# Patient Record
Sex: Female | Born: 1960 | Race: White | Hispanic: No | Marital: Married | Smoking: Never smoker
Health system: Southern US, Community
[De-identification: ages and names within clinical notes are randomized; demographics above are authoritative.]

## PROBLEM LIST (undated history)

## (undated) DIAGNOSIS — I82409 Acute embolism and thrombosis of unspecified deep veins of unspecified lower extremity: Secondary | ICD-10-CM

## (undated) DIAGNOSIS — I959 Hypotension, unspecified: Secondary | ICD-10-CM

## (undated) DIAGNOSIS — Z8489 Family history of other specified conditions: Secondary | ICD-10-CM

## (undated) DIAGNOSIS — E0590 Thyrotoxicosis, unspecified without thyrotoxic crisis or storm: Secondary | ICD-10-CM

## (undated) DIAGNOSIS — G43909 Migraine, unspecified, not intractable, without status migrainosus: Secondary | ICD-10-CM

## (undated) DIAGNOSIS — Z9289 Personal history of other medical treatment: Secondary | ICD-10-CM

## (undated) DIAGNOSIS — H04123 Dry eye syndrome of bilateral lacrimal glands: Secondary | ICD-10-CM

## (undated) DIAGNOSIS — E0500 Thyrotoxicosis with diffuse goiter without thyrotoxic crisis or storm: Secondary | ICD-10-CM

## (undated) DIAGNOSIS — R112 Nausea with vomiting, unspecified: Secondary | ICD-10-CM

## (undated) DIAGNOSIS — Z993 Dependence on wheelchair: Secondary | ICD-10-CM

## (undated) DIAGNOSIS — Z9889 Other specified postprocedural states: Secondary | ICD-10-CM

## (undated) DIAGNOSIS — T8859XA Other complications of anesthesia, initial encounter: Secondary | ICD-10-CM

## (undated) DIAGNOSIS — Z87828 Personal history of other (healed) physical injury and trauma: Secondary | ICD-10-CM

## (undated) DIAGNOSIS — E0591 Thyrotoxicosis, unspecified with thyrotoxic crisis or storm: Secondary | ICD-10-CM

## (undated) DIAGNOSIS — D649 Anemia, unspecified: Secondary | ICD-10-CM

## (undated) DIAGNOSIS — M1990 Unspecified osteoarthritis, unspecified site: Secondary | ICD-10-CM

## (undated) HISTORY — DX: Dry eye syndrome of bilateral lacrimal glands: H04.123

## (undated) HISTORY — PX: OTHER SURGICAL HISTORY: SHX169

## (undated) HISTORY — DX: Migraine, unspecified, not intractable, without status migrainosus: G43.909

## (undated) HISTORY — DX: Thyrotoxicosis with diffuse goiter without thyrotoxic crisis or storm: E05.00

## (undated) HISTORY — DX: Hypotension, unspecified: I95.9

## (undated) HISTORY — DX: Unspecified osteoarthritis, unspecified site: M19.90

## (undated) HISTORY — DX: Thyrotoxicosis, unspecified without thyrotoxic crisis or storm: E05.90

## (undated) HISTORY — DX: Acute embolism and thrombosis of unspecified deep veins of unspecified lower extremity: I82.409

## (undated) HISTORY — PX: BREAST REDUCTION SURGERY: SHX8

## (undated) HISTORY — DX: Anemia, unspecified: D64.9

## (undated) HISTORY — PX: ABDOMINAL HYSTERECTOMY: SHX81

---

## 1997-04-04 HISTORY — PX: TUBAL LIGATION: SHX77

## 2002-04-04 HISTORY — PX: OTHER SURGICAL HISTORY: SHX169

## 2008-10-09 ENCOUNTER — Encounter: Payer: Self-pay | Admitting: Endocrinology

## 2008-10-09 ENCOUNTER — Ambulatory Visit: Payer: Self-pay | Admitting: Obstetrics & Gynecology

## 2008-10-09 LAB — CONVERTED CEMR LAB: TSH: 0.039 microintl units/mL — ABNORMAL LOW (ref 0.350–4.500)

## 2008-10-13 ENCOUNTER — Ambulatory Visit (HOSPITAL_COMMUNITY): Admission: RE | Admit: 2008-10-13 | Discharge: 2008-10-13 | Payer: Self-pay | Admitting: Obstetrics & Gynecology

## 2008-10-14 ENCOUNTER — Encounter: Admission: RE | Admit: 2008-10-14 | Discharge: 2008-11-24 | Payer: Self-pay | Admitting: Obstetrics & Gynecology

## 2008-10-23 ENCOUNTER — Ambulatory Visit: Payer: Self-pay | Admitting: Obstetrics & Gynecology

## 2008-11-03 ENCOUNTER — Ambulatory Visit (HOSPITAL_COMMUNITY): Admission: RE | Admit: 2008-11-03 | Discharge: 2008-11-03 | Payer: Self-pay | Admitting: Obstetrics & Gynecology

## 2008-11-06 ENCOUNTER — Ambulatory Visit: Payer: Self-pay | Admitting: Obstetrics & Gynecology

## 2008-11-13 ENCOUNTER — Ambulatory Visit: Payer: Self-pay | Admitting: Endocrinology

## 2008-11-13 DIAGNOSIS — E0500 Thyrotoxicosis with diffuse goiter without thyrotoxic crisis or storm: Secondary | ICD-10-CM | POA: Insufficient documentation

## 2008-11-20 ENCOUNTER — Ambulatory Visit: Payer: Self-pay | Admitting: Obstetrics & Gynecology

## 2008-11-27 ENCOUNTER — Ambulatory Visit (HOSPITAL_COMMUNITY): Admission: RE | Admit: 2008-11-27 | Discharge: 2008-11-27 | Payer: Self-pay | Admitting: Obstetrics & Gynecology

## 2008-12-04 ENCOUNTER — Ambulatory Visit: Payer: Self-pay | Admitting: Family Medicine

## 2008-12-04 LAB — CONVERTED CEMR LAB
Free T4: 0.68 ng/dL — ABNORMAL LOW (ref 0.80–1.80)
T3, Free: 2.7 pg/mL (ref 2.3–4.2)
TSH: 0.798 microintl units/mL (ref 0.350–4.500)
Trich, Wet Prep: NONE SEEN

## 2008-12-09 ENCOUNTER — Ambulatory Visit: Payer: Self-pay | Admitting: Endocrinology

## 2008-12-18 ENCOUNTER — Ambulatory Visit (HOSPITAL_COMMUNITY): Admission: RE | Admit: 2008-12-18 | Discharge: 2008-12-18 | Payer: Self-pay

## 2008-12-18 ENCOUNTER — Encounter: Payer: Self-pay | Admitting: Obstetrics & Gynecology

## 2008-12-18 ENCOUNTER — Ambulatory Visit: Payer: Self-pay | Admitting: Obstetrics & Gynecology

## 2008-12-18 LAB — CONVERTED CEMR LAB
Hemoglobin: 10.3 g/dL — ABNORMAL LOW (ref 12.0–15.0)
Platelets: 247 10*3/uL (ref 150–400)
WBC: 6.4 10*3/uL (ref 4.0–10.5)

## 2008-12-29 ENCOUNTER — Ambulatory Visit: Payer: Self-pay | Admitting: Vascular Surgery

## 2008-12-29 ENCOUNTER — Encounter: Payer: Self-pay | Admitting: Obstetrics & Gynecology

## 2008-12-29 ENCOUNTER — Ambulatory Visit: Payer: Self-pay | Admitting: Obstetrics & Gynecology

## 2008-12-29 ENCOUNTER — Ambulatory Visit: Admission: RE | Admit: 2008-12-29 | Discharge: 2008-12-29 | Payer: Self-pay | Admitting: Obstetrics & Gynecology

## 2008-12-29 LAB — CONVERTED CEMR LAB
Free T4: 0.83 ng/dL (ref 0.80–1.80)
T3, Free: 2.6 pg/mL (ref 2.3–4.2)

## 2009-01-01 ENCOUNTER — Ambulatory Visit: Payer: Self-pay | Admitting: Endocrinology

## 2009-01-01 DIAGNOSIS — E0590 Thyrotoxicosis, unspecified without thyrotoxic crisis or storm: Secondary | ICD-10-CM | POA: Insufficient documentation

## 2009-01-05 ENCOUNTER — Ambulatory Visit: Payer: Self-pay | Admitting: Obstetrics & Gynecology

## 2009-01-08 ENCOUNTER — Ambulatory Visit (HOSPITAL_COMMUNITY): Admission: RE | Admit: 2009-01-08 | Discharge: 2009-01-08 | Payer: Self-pay | Admitting: Obstetrics & Gynecology

## 2009-01-12 ENCOUNTER — Other Ambulatory Visit: Payer: Self-pay | Admitting: Obstetrics & Gynecology

## 2009-01-12 ENCOUNTER — Ambulatory Visit: Payer: Self-pay | Admitting: Family Medicine

## 2009-01-15 ENCOUNTER — Ambulatory Visit: Payer: Self-pay | Admitting: Obstetrics & Gynecology

## 2009-01-19 ENCOUNTER — Ambulatory Visit: Payer: Self-pay | Admitting: Obstetrics & Gynecology

## 2009-01-22 ENCOUNTER — Inpatient Hospital Stay (HOSPITAL_COMMUNITY): Admission: AD | Admit: 2009-01-22 | Discharge: 2009-01-22 | Payer: Self-pay | Admitting: Obstetrics & Gynecology

## 2009-01-22 ENCOUNTER — Ambulatory Visit: Payer: Self-pay | Admitting: Obstetrics & Gynecology

## 2009-01-22 ENCOUNTER — Encounter: Payer: Self-pay | Admitting: Advanced Practice Midwife

## 2009-01-22 LAB — CONVERTED CEMR LAB
Chloride: 107 meq/L (ref 96–112)
Creatinine 24 HR UR: 1322 mg/24hr (ref 700–1800)
Creatinine Clearance: 121 mL/min — ABNORMAL HIGH (ref 75–115)
Creatinine, Ser: 0.76 mg/dL (ref 0.40–1.20)
Creatinine, Urine: 81.3 mg/dL
HCT: 31.5 % — ABNORMAL LOW (ref 36.0–46.0)
Hemoglobin: 10 g/dL — ABNORMAL LOW (ref 12.0–15.0)
MCHC: 31.7 g/dL (ref 30.0–36.0)
MCV: 90 fL (ref 78.0–100.0)
Potassium: 4.3 meq/L (ref 3.5–5.3)
Protein, Ur: 260 mg/24hr — ABNORMAL HIGH (ref 50–100)
RDW: 14.1 % (ref 11.5–15.5)
Sodium: 139 meq/L (ref 135–145)
Total Protein: 5.5 g/dL — ABNORMAL LOW (ref 6.0–8.3)

## 2009-01-26 ENCOUNTER — Ambulatory Visit: Payer: Self-pay | Admitting: Obstetrics & Gynecology

## 2009-01-29 ENCOUNTER — Ambulatory Visit: Payer: Self-pay | Admitting: Obstetrics & Gynecology

## 2009-01-29 ENCOUNTER — Ambulatory Visit (HOSPITAL_COMMUNITY): Admission: RE | Admit: 2009-01-29 | Discharge: 2009-01-29 | Payer: Self-pay | Admitting: Obstetrics & Gynecology

## 2009-01-29 ENCOUNTER — Encounter: Payer: Self-pay | Admitting: Family Medicine

## 2009-02-02 ENCOUNTER — Ambulatory Visit: Payer: Self-pay | Admitting: Family Medicine

## 2009-02-02 LAB — CONVERTED CEMR LAB
Chlamydia, DNA Probe: NEGATIVE
GC Probe Amp, Genital: NEGATIVE
T3, Total: 205.3 ng/dL — ABNORMAL HIGH (ref 80.0–204.0)
T4, Total: 10.2 ug/dL (ref 5.0–12.5)

## 2009-02-03 ENCOUNTER — Ambulatory Visit: Payer: Self-pay | Admitting: Endocrinology

## 2009-02-05 ENCOUNTER — Ambulatory Visit: Payer: Self-pay | Admitting: Obstetrics & Gynecology

## 2009-02-09 ENCOUNTER — Encounter: Payer: Self-pay | Admitting: Obstetrics & Gynecology

## 2009-02-09 ENCOUNTER — Ambulatory Visit: Payer: Self-pay | Admitting: Obstetrics & Gynecology

## 2009-02-09 LAB — CONVERTED CEMR LAB
ALT: 9 units/L (ref 0–35)
Alkaline Phosphatase: 99 units/L (ref 39–117)
CO2: 21 meq/L (ref 19–32)
Calcium: 7.8 mg/dL — ABNORMAL LOW (ref 8.4–10.5)
Creatinine, Ser: 0.84 mg/dL (ref 0.40–1.20)
Hemoglobin: 10.1 g/dL — ABNORMAL LOW (ref 12.0–15.0)
Platelets: 212 10*3/uL (ref 150–400)
RBC: 3.66 M/uL — ABNORMAL LOW (ref 3.87–5.11)
Sodium: 139 meq/L (ref 135–145)
Total Protein: 4.9 g/dL — ABNORMAL LOW (ref 6.0–8.3)

## 2009-02-10 ENCOUNTER — Ambulatory Visit: Payer: Self-pay | Admitting: Obstetrics & Gynecology

## 2009-02-10 LAB — CONVERTED CEMR LAB
Collection Interval-CRCL: 24 hr
Creatinine 24 HR UR: 1533 mg/24hr (ref 700–1800)
Creatinine Clearance: 127 mL/min — ABNORMAL HIGH (ref 75–115)
Creatinine, Urine: 54.8 mg/dL

## 2009-02-12 ENCOUNTER — Ambulatory Visit: Payer: Self-pay | Admitting: Family Medicine

## 2009-02-12 ENCOUNTER — Inpatient Hospital Stay (HOSPITAL_COMMUNITY): Admission: AD | Admit: 2009-02-12 | Discharge: 2009-02-20 | Payer: Self-pay | Admitting: Family Medicine

## 2009-02-12 ENCOUNTER — Ambulatory Visit: Payer: Self-pay | Admitting: Obstetrics & Gynecology

## 2009-02-18 ENCOUNTER — Ambulatory Visit: Payer: Self-pay | Admitting: Family Medicine

## 2009-03-04 ENCOUNTER — Ambulatory Visit: Payer: Self-pay | Admitting: Obstetrics and Gynecology

## 2009-04-01 ENCOUNTER — Ambulatory Visit: Payer: Self-pay | Admitting: Obstetrics and Gynecology

## 2009-04-02 ENCOUNTER — Encounter: Payer: Self-pay | Admitting: Obstetrics & Gynecology

## 2009-04-02 LAB — CONVERTED CEMR LAB

## 2009-05-01 ENCOUNTER — Telehealth: Payer: Self-pay | Admitting: Endocrinology

## 2009-05-01 ENCOUNTER — Ambulatory Visit: Payer: Self-pay | Admitting: Endocrinology

## 2009-05-03 LAB — CONVERTED CEMR LAB: Free T4: 0.7 ng/dL (ref 0.6–1.6)

## 2009-05-04 ENCOUNTER — Ambulatory Visit: Payer: Self-pay | Admitting: Endocrinology

## 2009-10-22 ENCOUNTER — Ambulatory Visit: Payer: Self-pay | Admitting: Endocrinology

## 2009-10-22 LAB — CONVERTED CEMR LAB: TSH: 0.02 microintl units/mL — ABNORMAL LOW (ref 0.35–5.50)

## 2009-10-23 ENCOUNTER — Ambulatory Visit: Payer: Self-pay | Admitting: Endocrinology

## 2009-10-23 ENCOUNTER — Encounter: Payer: Self-pay | Admitting: Internal Medicine

## 2009-10-23 DIAGNOSIS — M79609 Pain in unspecified limb: Secondary | ICD-10-CM

## 2009-10-23 DIAGNOSIS — M25539 Pain in unspecified wrist: Secondary | ICD-10-CM

## 2009-10-23 DIAGNOSIS — M2550 Pain in unspecified joint: Secondary | ICD-10-CM | POA: Insufficient documentation

## 2009-10-23 LAB — CONVERTED CEMR LAB
Basophils Relative: 0.4 % (ref 0.0–3.0)
Eosinophils Relative: 1 % (ref 0.0–5.0)
Hemoglobin: 13.4 g/dL (ref 12.0–15.0)
Lymphs Abs: 1.4 10*3/uL (ref 0.7–4.0)
MCV: 87.1 fL (ref 78.0–100.0)
Monocytes Absolute: 0.4 10*3/uL (ref 0.1–1.0)
Neutro Abs: 2.1 10*3/uL (ref 1.4–7.7)
Neutrophils Relative %: 53.1 % (ref 43.0–77.0)
Platelets: 238 10*3/uL (ref 150.0–400.0)
WBC: 3.9 10*3/uL — ABNORMAL LOW (ref 4.5–10.5)

## 2009-10-26 ENCOUNTER — Telehealth: Payer: Self-pay | Admitting: Internal Medicine

## 2009-10-26 ENCOUNTER — Telehealth (INDEPENDENT_AMBULATORY_CARE_PROVIDER_SITE_OTHER): Payer: Self-pay | Admitting: *Deleted

## 2009-11-02 LAB — CONVERTED CEMR LAB: Anti Nuclear Antibody(ANA): NEGATIVE

## 2009-12-02 ENCOUNTER — Telehealth: Payer: Self-pay | Admitting: Endocrinology

## 2010-02-16 ENCOUNTER — Encounter: Payer: Self-pay | Admitting: Endocrinology

## 2010-03-12 ENCOUNTER — Telehealth: Payer: Self-pay | Admitting: Endocrinology

## 2010-03-12 ENCOUNTER — Encounter: Payer: Self-pay | Admitting: Endocrinology

## 2010-03-17 ENCOUNTER — Telehealth: Payer: Self-pay | Admitting: Endocrinology

## 2010-04-25 ENCOUNTER — Encounter: Payer: Self-pay | Admitting: Obstetrics & Gynecology

## 2010-04-26 ENCOUNTER — Encounter: Payer: Self-pay | Admitting: Obstetrics and Gynecology

## 2010-05-04 NOTE — Progress Notes (Signed)
Summary: Rx refill req  Phone Note Refill Request Message from:  Patient on December 02, 2009 10:39 AM  Refills Requested: Medication #1:  PROPYLTHIOURACIL 50 MG TABS 1 by mouth two times a day.   Dosage confirmed as above?Dosage Confirmed   Supply Requested: 6 months Pt is currently in [place], pt is requesting printed Rx be faxed to local pharmacy   Method Requested: Fax to Local Pharmacy Initial call taken by: Margaret Pyle, CMA,  December 02, 2009 10:40 AM  Follow-up for Phone Call        i printed refill x 1 ov is due Follow-up by: Minus Breeding MD,  December 02, 2009 10:57 AM  Additional Follow-up for Phone Call Additional follow up Details #1::        Pt has moved to [place] but does not want to change doctors, she will be in [place] in 04/2010 and will sch appt then. Okay for 6 month supply? Additional Follow-up by: Margaret Pyle, CMA,  December 02, 2009 11:03 AM    Additional Follow-up for Phone Call Additional follow up Details #2::    i am happy to see you in f/u in january, but you shoud see any dr for a thyroid test in the next 1-2 mos in the [place], to hold you until you ret to , as i don't want your thyroid to go way off Follow-up by: Minus Breeding MD,  December 02, 2009 11:09 AM  Additional Follow-up for Phone Call Additional follow up Details #3:: Details for Additional Follow-up Action Taken: Pt informed via VM, Rx faxed to local [facility] [phone] Additional Follow-up by: Margaret Pyle, CMA,  December 02, 2009 1:21 PM  Prescriptions: PROPYLTHIOURACIL 50 MG TABS (PROPYLTHIOURACIL) 1 by mouth two times a day  #60 x 0   Entered by:   Minus Breeding MD   Authorized by:   Margaret Pyle, CMA   Signed by:   Minus Breeding MD on 12/02/2009   Method used:   Printed then faxed to ...       [facility] #[id number]* (retail)       [address] [place], [place]  [address]       Ph: [phone]       Fax: [phone]   RxID:    [id number]

## 2010-05-04 NOTE — Progress Notes (Signed)
Summary: Rx change/SAE pt  Phone Note Call from Patient Call back at Home Phone [phone]   Caller: Patient Summary of Call: Pt called requesting Rx for Methimazole be switched back to PTU-Propylthiouracil. Pt states she does not like the taste of Methimazole. Initial call taken by: Margaret Pyle, CMA,  October 26, 2009 4:39 PM  Follow-up for Phone Call        will do x 30day supply, but needs to have f/u with SAE later in the next 30 days to discuss this med and its use - thanks Follow-up by: Newt Lukes MD,  October 26, 2009 4:55 PM  Additional Follow-up for Phone Call Additional follow up Details #1::        Pt informed and will scheduled follow up with SAE Additional Follow-up by: Margaret Pyle, CMA,  October 27, 2009 8:11 AM    New/Updated Medications: PROPYLTHIOURACIL 50 MG TABS (PROPYLTHIOURACIL) 1 by mouth two times a day Prescriptions: PROPYLTHIOURACIL 50 MG TABS (PROPYLTHIOURACIL) 1 by mouth two times a day  #60 x 0   Entered and Authorized by:   Newt Lukes MD   Signed by:   Newt Lukes MD on 10/26/2009   Method used:   Electronically to        [facility] #[id number]* (retail)       [address] [place], [place]  [address]       Ph: [phone]       Fax: [phone]   RxID:   [id number]

## 2010-05-04 NOTE — Progress Notes (Signed)
Summary: LAB REQUEST  Phone Note Call from Patient Call back at Home Phone [phone]   Caller: Patient Call For: Minus Breeding MD Summary of Call: Pt came into the office stated that she was suppose to have her lab done today, but there was no order. Pt stated that she aways have her lab work done before she see Dr.Yoltzin Barg and Pt would like Dr. Everardo All to order her a lab work.  Initial call taken by: Livingston Diones,  May 01, 2009 11:51 AM  Follow-up for Phone Call        pt has appt monday 05/04/2009. pt is requesting to have her TSH checked, please advise Follow-up by: Margaret Pyle, CMA,  May 01, 2009 11:56 AM  Additional Follow-up for Phone Call Additional follow up Details #1::        tsh and free t4 242.9 Additional Follow-up by: Minus Breeding MD,  May 01, 2009 12:45 PM    Additional Follow-up for Phone Call Additional follow up Details #2::    labs scheduled today. pt informed via VM and told to call back if needed.  Follow-up by: Margaret Pyle, CMA,  May 01, 2009 1:07 PM

## 2010-05-04 NOTE — Progress Notes (Signed)
  Phone Note Other Incoming   Request: Send information Summary of Call: Received a [facility] medical record release requesting for records to be sent to [facility] @ [address] [place],  [place] [address]. Request forwarded to [facility] for records from Jan 2010 to present to be sent.

## 2010-05-04 NOTE — Assessment & Plan Note (Signed)
Summary: 2-3 MTH FU--- PER PT JAN APPT [facility]   Vital Signs:  Patient profile:   50 year old female Height:      67 inches (170.18 cm) Weight:      161 pounds (73.18 kg) O2 Sat:      98 % on Room air Temp:     96.2 degrees F (35.67 degrees C) oral Pulse rate:   61 / minute BP sitting:   100 / 60  (left arm) Cuff size:   regular  Vitals Entered By: Josph Macho CMA (May 04, 2009 10:34 AM)  O2 Flow:  Room air CC: 2-3 month follow up/ pt states she is no longer taking Promethazine/ CF Is Patient Diabetic? No   Primary Provider:  [facility]  CC:  2-3 month follow up/ pt states she is no longer taking Promethazine/ CF.  History of Present Illness: pt is now 2 1/2 mos postpartum.  pt states she feels well in general.  she gained 45 lbs with her pregnancy (twins), and has since re-lost 40 of that.  she has been off ptu x 4 months.  Current Medications (verified): 1)  Promethazine Hcl 12.5 Mg Tabs (Promethazine Hcl) .Marland Kitchen.. 1 Tab Every 4-6 Hrs As Needed 2)  Se-Natal .... 1 Daily  Allergies (verified): No Known Drug Allergies  Past History:  Past Medical History: HYPERTHYROIDISM (ICD-242.90) GRAVES' DISEASE (ICD-242.00) * MIGRAINES  Review of Systems  The patient denies fever.    Physical Exam  General:  normal appearance.   Neck:  thyroid is 3x normal size on the right, and only slightly enlarged on the left.  no nodule. Additional Exam:  FastTSH                   0.68 uIU/mL                 0.35-5.50 Free T4                   0.7 ng/dL    Impression & Recommendations:  Problem # 1:  HYPERTHYROIDISM (ICD-242.90) no rx needed now.  Other Orders: Est. Patient Level III ([address])  Patient Instructions: 1)  no need for thyroid medication now. 2)  return 6 months (with tsh prior, 242.00), or sooner if you feel significanly different.

## 2010-05-04 NOTE — Assessment & Plan Note (Signed)
Summary: follow up-lb   Vital Signs:  Patient profile:   50 year old female Height:      67 inches (170.18 cm) Weight:      154.50 pounds (70.23 kg) BMI:     24.29 O2 Sat:      97 % on Room air Temp:     98.7 degrees F (37.06 degrees C) oral Pulse rate:   71 / minute BP sitting:   106 / 64  (left arm) Cuff size:   regular  Vitals Entered By: Brenton Grills MA (October 23, 2009 10:58 AM)  O2 Flow:  Room air CC: F/U appt/wants to discuss thyroid/referral to a PCP-inflammatory process/aj Comments pt is no longer taking Promethazine or Se-Natal   Primary Provider:  [facility]  CC:  F/U appt/wants to discuss thyroid/referral to a PCP-inflammatory process/aj.  History of Present Illness: pt has not taken her ptu since last year.  she is still breast-feeding.   pt states 1 week of moderate arthralgias, worst at the shoulders, elbows, wrists, knees, fingers, and hips.     Current Medications (verified): 1)  Promethazine Hcl 12.5 Mg Tabs (Promethazine Hcl) .Marland Kitchen.. 1 Tab Every 4-6 Hrs As Needed 2)  Se-Natal .... 1 Daily  Allergies (verified): No Known Drug Allergies  Review of Systems       no numbness  Physical Exam  General:  normal appearance.   Neck:  thyroid is 3x normal size on the right, and only slightly enlarged on the left.  no nodule. Extremities:  right wrist and hand are normal, except for pain upon rom   Impression & Recommendations:  Problem # 1:  HYPERTHYROIDISM (ICD-242.90) therapy limited by noncompliance.  i'll do the best i can.  Problem # 2:  HAND PAIN (ICD-729.5) uncertain etiology  Problem # 3:  WRIST PAIN (EAV-409.81) uncertain etiology  Medications Added to Medication List This Visit: 1)  Methimazole 10 Mg Tabs (Methimazole) .Marland Kitchen.. 1 once daily  Other Orders: T-Antinuclear Antib (ANA) [phone]) T- * Misc. Laboratory test [phone]) TLB-CBC Platelet - w/Differential (85025-CBCD) TLB-Sedimentation Rate (ESR) (85652-ESR) Est.  Patient Level II ([address])  Patient Instructions: 1)  blood tests are being ordered for you today.  please call [phone] to hear your test results. 2)  x ray of the right wrist and hand. 3)  call if you wish to be referred to a rheumatologist. 4)  take methimazole 10 mg once daily 5)  if ever you have fever while taking this medication, stop it and call us, because of the risk of a rare side-effect. 6)  Please schedule a follow-up appointment in 6 weeks. 7)  (update:  x ray cancelled for now, because machine is down). Prescriptions: METHIMAZOLE 10 MG TABS (METHIMAZOLE) 1 once daily  #30 x 2   Entered and Authorized by:   Minus Breeding MD   Signed by:   Minus Breeding MD on 10/23/2009   Method used:   Electronically to        [facility] #[id number]* (retail)       [address] [place], [place]  [address]       Ph: [phone]       Fax: [phone]   RxID:   [id number]

## 2010-05-06 NOTE — Letter (Signed)
Summary: Faxed Letter from patient  Faxed Letter from patient   Imported By: Lennie Odor 03/19/2010 16:11:51  _____________________________________________________________________  External Attachment:    Type:   Image     Comment:   External Document

## 2010-05-06 NOTE — Letter (Signed)
Summary: Faxed Letter from patient  Faxed Letter from patient   Imported By: Lennie Odor 03/19/2010 16:11:11  _____________________________________________________________________  External Attachment:    Type:   Image     Comment:   External Document

## 2010-05-06 NOTE — Progress Notes (Signed)
Summary: rx change  Phone Note Call from Patient Call back at Home Phone [phone]   Caller: Patient Summary of Call: Pt called back stating that she does not want PTU, it tastes too bad, she would like a Rx for Methamaxole instead to [facility] (fax [phone]), please advise. Initial call taken by: Margaret Pyle, CMA,  March 17, 2010 9:16 AM  Follow-up for Phone Call        i printed if ever you have fever while taking this medication, stop it and call us, because of the risk of a rare side-effect Follow-up by: Minus Breeding MD,  March 17, 2010 11:19 AM  Additional Follow-up for Phone Call Additional follow up Details #1::        Pt advised via VM, Rx faxed to pharmacy per pt request Additional Follow-up by: Margaret Pyle, CMA,  March 17, 2010 11:28 AM    New/Updated Medications: METHIMAZOLE 10 MG TABS (METHIMAZOLE) 1 tab once daily Prescriptions: METHIMAZOLE 10 MG TABS (METHIMAZOLE) 1 tab once daily  #30 x 0   Entered and Authorized by:   Minus Breeding MD   Signed by:   Minus Breeding MD on 03/17/2010   Method used:   Printed then faxed to ...       [facility] #[id number]* (retail)       [address] [place], [place]  [address]       Ph: [phone]       Fax: [phone]   RxID:   [id number]

## 2010-05-06 NOTE — Progress Notes (Signed)
Summary: Fax for RF  Phone Note Call from Patient Call back at Home Phone [phone]   Summary of Call: Pt left vm on triage, she faxed 4 pages to Dr George Hugh attention today. They were labs for TSH. She is currently out of town - dx w/lymes. ON 2 antibiotic's.  Initial call taken by: Lamar Sprinkles, CMA,  March 12, 2010 5:25 PM  Follow-up for Phone Call        i am awaiting faxes Follow-up by: Minus Breeding MD,  March 15, 2010 12:41 PM  Additional Follow-up for Phone Call Additional follow up Details #1::        Pt advised that faxed was not recieved and will re-fax to [facility] Additional Follow-up by: Margaret Pyle, CMA,  March 16, 2010 3:23 PM    Additional Follow-up for Phone Call Additional follow up Details #2::    faxed placed on MD's desk for review Follow-up by: Brenton Grills CMA Duncan Dull),  March 16, 2010 4:58 PM  Additional Follow-up for Phone Call Additional follow up Details #3:: Details for Additional Follow-up Action Taken: i printed rx x 1 month.  for your safety, you should see a dr where you are, for the thyroid.  then return here when you return to [place]. Additional Follow-up by: Minus Breeding MD,  March 17, 2010 7:59 AM  Prescriptions: PROPYLTHIOURACIL 50 MG TABS (PROPYLTHIOURACIL) 1 by mouth two times a day  #60 x 0   Entered and Authorized by:   Minus Breeding MD   Signed by:   Minus Breeding MD on 03/17/2010   Method used:   Printed then faxed to ...       [facility] #[id number]* (retail)       [address] [place], [place]  [address]       Ph: [phone]       Fax: [phone]   RxID:   [id number]  [facility] [place] (see previous phone note) Pt advised via VM. Margaret Pyle, CMA  March 17, 2010 8:10 AM

## 2010-05-10 ENCOUNTER — Telehealth: Payer: Self-pay | Admitting: Endocrinology

## 2010-05-12 ENCOUNTER — Telehealth: Payer: Self-pay | Admitting: Endocrinology

## 2010-05-18 ENCOUNTER — Other Ambulatory Visit: Payer: Self-pay

## 2010-05-18 ENCOUNTER — Other Ambulatory Visit: Payer: Self-pay | Admitting: Endocrinology

## 2010-05-18 ENCOUNTER — Encounter (INDEPENDENT_AMBULATORY_CARE_PROVIDER_SITE_OTHER): Payer: Self-pay | Admitting: *Deleted

## 2010-05-18 DIAGNOSIS — E0590 Thyrotoxicosis, unspecified without thyrotoxic crisis or storm: Secondary | ICD-10-CM

## 2010-05-20 NOTE — Progress Notes (Signed)
Summary: Lab req  Phone Note Call from Patient Call back at Home Phone [phone]   Caller: Patient Summary of Call: Pt called requesting TSH be drawn prior to her appt 02/17 Initial call taken by: Margaret Pyle, CMA,  May 12, 2010 10:49 AM  Follow-up for Phone Call        ok 242.90 Follow-up by: Minus Breeding MD,  May 12, 2010 11:07 AM  Additional Follow-up for Phone Call Additional follow up Details #1::        labs scheduled for Monday 02/13 at 9, pt informed Additional Follow-up by: Margaret Pyle, CMA,  May 13, 2010 8:32 AM

## 2010-05-20 NOTE — Progress Notes (Signed)
Summary: rx refill req  Phone Note Refill Request Message from:  Fax from Pharmacy on May 10, 2010 10:07 AM  Refills Requested: Medication #1:  METHIMAZOLE 10 MG TABS 1 tab once daily.   Dosage confirmed as above?Dosage Confirmed   Last Refilled: 04/12/2010  Method Requested: Electronic Next Appointment Scheduled: none Initial call taken by: Brenton Grills CMA (AAMA),  May 10, 2010 10:07 AM    Prescriptions: METHIMAZOLE 10 MG TABS (METHIMAZOLE) 1 tab once daily  #30 x 0   Entered by:   Brenton Grills CMA (AAMA)   Authorized by:   Minus Breeding MD   Signed by:   Brenton Grills CMA (AAMA) on 05/10/2010   Method used:   Electronically to        [id number]* (retail)       [address] [place], [place]  [address]       Ph: [phone]       Fax: [phone]   RxID:   [id number]

## 2010-05-21 ENCOUNTER — Ambulatory Visit: Payer: Self-pay | Admitting: Endocrinology

## 2010-05-24 ENCOUNTER — Ambulatory Visit: Payer: Self-pay | Admitting: Endocrinology

## 2010-05-24 ENCOUNTER — Encounter: Payer: Self-pay | Admitting: Endocrinology

## 2010-05-24 ENCOUNTER — Ambulatory Visit (INDEPENDENT_AMBULATORY_CARE_PROVIDER_SITE_OTHER): Payer: Self-pay | Admitting: Endocrinology

## 2010-05-24 DIAGNOSIS — E0590 Thyrotoxicosis, unspecified without thyrotoxic crisis or storm: Secondary | ICD-10-CM

## 2010-05-28 ENCOUNTER — Ambulatory Visit: Payer: Self-pay | Admitting: Endocrinology

## 2010-06-01 NOTE — Assessment & Plan Note (Signed)
Summary: FOLLOW UP/NWS  #   Vital Signs:  Patient profile:   50 year old female Height:      67 inches (170.18 cm) Weight:      155 pounds (70.45 kg) BMI:     24.36 O2 Sat:      95 % on Room air Temp:     98.2 degrees F (36.78 degrees C) oral Pulse rate:   66 / minute Pulse rhythm:   regular BP sitting:   108 / 78  (left arm) Cuff size:   regular  Vitals Entered By: Brenton Grills CMA (AAMA) (May 24, 2010 11:13 AM)  O2 Flow:  Room air CC: Follow up on thyroid/aj/pt declined flu shot/aj Is Patient Diabetic? No Comments pt is due for mammogram and pap and has never had a colonoscopy   Primary Provider:  [facility]  CC:  Follow up on thyroid/aj/pt declined flu shot/aj.  History of Present Illness: pt is here for f/u of hyperthyroidism.  she has been on the same dosage of methimazole, x more than 1 year.  she says she is being rx'ed for chronic lyme dz.  main symptom is arthralgias.    Current Medications (verified): 1)  Methimazole 10 Mg Tabs (Methimazole) .Marland Kitchen.. 1 Tab Once Daily 2)  Plaquenil 200 Mg Tabs (Hydroxychloroquine Sulfate) .Marland Kitchen.. 1 Tablet By Mouth Once Daily 3)  Clarithromycin 500 Mg Tabs (Clarithromycin) .Marland Kitchen.. 1 Tablet By Mouth Two Times A Day For 4 Weeks  Allergies (verified): No Known Drug Allergies  Past History:  Past Medical History: Last updated: 05/04/2009 HYPERTHYROIDISM (ICD-242.90) GRAVES' DISEASE (ICD-242.00) * MIGRAINES  Review of Systems  The patient denies fever.    Physical Exam  General:  normal appearance.   Neck:  thyroid is 4x normal size on the right, and only slightly enlarged on the left.  no nodule.   Impression & Recommendations:  Problem # 1:  HYPERTHYROIDISM (ICD-242.90) well-controlled FT4: 1.26 (10/22/2009)   FT3: 2.6 (12/29/2008)   TSH: 1.96 (05/18/2010)     Medications Added to Medication List This Visit: 1)  Plaquenil 200 Mg Tabs (Hydroxychloroquine sulfate) .Marland Kitchen.. 1 tablet by mouth once daily 2)   Clarithromycin 500 Mg Tabs (Clarithromycin) .Marland Kitchen.. 1 tablet by mouth two times a day for 4 weeks  Other Orders: Est. Patient Level II ([address])  Patient Instructions: 1)  continue the same methimazole. 2)  if ever you have fever while taking methimazole, stop it and call us, because of the risk of a rare side-effect 3)  return here in 6 months. Prescriptions: METHIMAZOLE 10 MG TABS (METHIMAZOLE) 1 tab once daily  #90 x 2   Entered and Authorized by:   Minus Breeding MD   Signed by:   Minus Breeding MD on 05/24/2010   Method used:   Print then Give to Patient   RxID:   [id number]    Orders Added: 1)  Est. Patient Level II [[address]]

## 2010-06-15 ENCOUNTER — Telehealth: Payer: Self-pay | Admitting: Endocrinology

## 2010-06-22 NOTE — Progress Notes (Signed)
Summary: Rx req  Phone Note Call from Patient   Caller: Patient [phone] Summary of Call: Pt called stating that she lost Rx for Methimazole and is currenlty in [place]. Pt [place] the [place] mth today at 4pm and is requesting #120 x 0 for her trip to be called to a local [facility] [phone]. Please advise. Initial call taken by: Margaret Pyle, CMA,  June 15, 2010 8:52 AM  Follow-up for Phone Call        ok to refill Follow-up by: Minus Breeding MD,  June 15, 2010 9:16 AM  Additional Follow-up for Phone Call Additional follow up Details #1::        Pt's spouse advised Additional Follow-up by: Margaret Pyle, CMA,  June 15, 2010 10:53 AM    Prescriptions: METHIMAZOLE 10 MG TABS (METHIMAZOLE) 1 tab once daily  #120 x 1   Entered and Authorized by:   Margaret Pyle, CMA   Signed by:   Margaret Pyle, CMA on 06/15/2010   Method used:   Telephoned to ...       [facility] #[id number]* (retail)       [address] [place], [place]  [address]       Ph: [phone]       Fax: [phone]   RxID:   [id number]

## 2010-07-05 LAB — POCT URINALYSIS DIP (DEVICE)
Bilirubin Urine: NEGATIVE
Ketones, ur: NEGATIVE mg/dL
Specific Gravity, Urine: 1.02 (ref 1.005–1.030)
pH: 6 (ref 5.0–8.0)

## 2010-07-06 LAB — POCT URINALYSIS DIP (DEVICE)
Bilirubin Urine: NEGATIVE
Glucose, UA: NEGATIVE mg/dL
Nitrite: NEGATIVE
Urobilinogen, UA: 0.2 mg/dL (ref 0.0–1.0)

## 2010-07-07 LAB — COMPREHENSIVE METABOLIC PANEL
ALT: 13 U/L (ref 0–35)
ALT: 13 U/L (ref 0–35)
ALT: 15 U/L (ref 0–35)
ALT: 12 U/L (ref 0–35)
AST: 36 U/L (ref 0–37)
AST: 37 U/L (ref 0–37)
AST: 22 U/L (ref 0–37)
Albumin: 1.4 g/dL — ABNORMAL LOW (ref 3.5–5.2)
Albumin: 2.1 g/dL — ABNORMAL LOW (ref 3.5–5.2)
Albumin: 1.8 g/dL — ABNORMAL LOW (ref 3.5–5.2)
Albumin: 2 g/dL — ABNORMAL LOW (ref 3.5–5.2)
Alkaline Phosphatase: 107 U/L (ref 39–117)
Alkaline Phosphatase: 126 U/L — ABNORMAL HIGH (ref 39–117)
Alkaline Phosphatase: 128 U/L — ABNORMAL HIGH (ref 39–117)
Alkaline Phosphatase: 71 U/L (ref 39–117)
Alkaline Phosphatase: 120 U/L — ABNORMAL HIGH (ref 39–117)
Alkaline Phosphatase: 92 U/L (ref 39–117)
Alkaline Phosphatase: 97 U/L (ref 39–117)
Alkaline Phosphatase: 97 U/L (ref 39–117)
BUN: 12 mg/dL (ref 6–23)
BUN: 12 mg/dL (ref 6–23)
BUN: 13 mg/dL (ref 6–23)
BUN: 11 mg/dL (ref 6–23)
BUN: 7 mg/dL (ref 6–23)
BUN: 9 mg/dL (ref 6–23)
BUN: 9 mg/dL (ref 6–23)
CO2: 23 mEq/L (ref 19–32)
CO2: 24 mEq/L (ref 19–32)
CO2: 28 mEq/L (ref 19–32)
CO2: 24 mEq/L (ref 19–32)
CO2: 26 mEq/L (ref 19–32)
CO2: 26 mEq/L (ref 19–32)
Calcium: 6.2 mg/dL — CL (ref 8.4–10.5)
Calcium: 7.5 mg/dL — ABNORMAL LOW (ref 8.4–10.5)
Calcium: 7.5 mg/dL — ABNORMAL LOW (ref 8.4–10.5)
Calcium: 8.1 mg/dL — ABNORMAL LOW (ref 8.4–10.5)
Calcium: 8.1 mg/dL — ABNORMAL LOW (ref 8.4–10.5)
Chloride: 100 mEq/L (ref 96–112)
Chloride: 100 mEq/L (ref 96–112)
Chloride: 98 mEq/L (ref 96–112)
Chloride: 103 mEq/L (ref 96–112)
Chloride: 107 mEq/L (ref 96–112)
Creatinine, Ser: 1.14 mg/dL (ref 0.4–1.2)
Creatinine, Ser: 0.83 mg/dL (ref 0.4–1.2)
Creatinine, Ser: 0.96 mg/dL (ref 0.4–1.2)
GFR calc Af Amer: 59 mL/min — ABNORMAL LOW (ref >60)
GFR calc Af Amer: >60 mL/min (ref >60)
GFR calc Af Amer: >60 mL/min (ref >60)
GFR calc non Af Amer: 47 mL/min — ABNORMAL LOW (ref >60)
GFR calc non Af Amer: 57 mL/min — ABNORMAL LOW (ref >60)
GFR calc non Af Amer: >60 mL/min (ref >60)
GFR calc non Af Amer: >60 mL/min (ref >60)
GFR calc non Af Amer: >60 mL/min (ref >60)
GFR calc non Af Amer: >60 mL/min (ref >60)
Glucose, Bld: 118 mg/dL — ABNORMAL HIGH (ref 70–99)
Glucose, Bld: 92 mg/dL (ref 70–99)
Glucose, Bld: 75 mg/dL (ref 70–99)
Glucose, Bld: 79 mg/dL (ref 70–99)
Glucose, Bld: 92 mg/dL (ref 70–99)
Potassium: 4.3 mEq/L (ref 3.5–5.1)
Potassium: 4.4 mEq/L (ref 3.5–5.1)
Potassium: 4.4 mEq/L (ref 3.5–5.1)
Potassium: 4.7 mEq/L (ref 3.5–5.1)
Potassium: 4.3 mEq/L (ref 3.5–5.1)
Potassium: 4.4 mEq/L (ref 3.5–5.1)
Potassium: 4.5 mEq/L (ref 3.5–5.1)
Potassium: 4.5 mEq/L (ref 3.5–5.1)
Sodium: 131 mEq/L — ABNORMAL LOW (ref 135–145)
Sodium: 132 mEq/L — ABNORMAL LOW (ref 135–145)
Sodium: 137 mEq/L (ref 135–145)
Sodium: 135 mEq/L (ref 135–145)
Total Bilirubin: 0.2 mg/dL — ABNORMAL LOW (ref 0.3–1.2)
Total Bilirubin: 0.3 mg/dL (ref 0.3–1.2)
Total Bilirubin: 0.3 mg/dL (ref 0.3–1.2)
Total Bilirubin: 0.4 mg/dL (ref 0.3–1.2)
Total Bilirubin: 0.2 mg/dL — ABNORMAL LOW (ref 0.3–1.2)
Total Bilirubin: 0.4 mg/dL (ref 0.3–1.2)
Total Protein: 3.7 g/dL — ABNORMAL LOW (ref 6.0–8.3)
Total Protein: 5.4 g/dL — ABNORMAL LOW (ref 6.0–8.3)
Total Protein: 4.9 g/dL — ABNORMAL LOW (ref 6.0–8.3)
Total Protein: 5 g/dL — ABNORMAL LOW (ref 6.0–8.3)
Total Protein: 5.1 g/dL — ABNORMAL LOW (ref 6.0–8.3)
Total Protein: 5.2 g/dL — ABNORMAL LOW (ref 6.0–8.3)

## 2010-07-07 LAB — CBC
HCT: 22.8 % — ABNORMAL LOW (ref 36.0–46.0)
HCT: 26.4 % — ABNORMAL LOW (ref 36.0–46.0)
HCT: 29.9 % — ABNORMAL LOW (ref 36.0–46.0)
HCT: 34.9 % — ABNORMAL LOW (ref 36.0–46.0)
HCT: 29.2 % — ABNORMAL LOW (ref 36.0–46.0)
HCT: 30.8 % — ABNORMAL LOW (ref 36.0–46.0)
HCT: 33 % — ABNORMAL LOW (ref 36.0–46.0)
Hemoglobin: 7.6 g/dL — ABNORMAL LOW (ref 12.0–15.0)
Hemoglobin: 9.9 g/dL — ABNORMAL LOW (ref 12.0–15.0)
Hemoglobin: 10.2 g/dL — ABNORMAL LOW (ref 12.0–15.0)
Hemoglobin: 10.6 g/dL — ABNORMAL LOW (ref 12.0–15.0)
Hemoglobin: 11 g/dL — ABNORMAL LOW (ref 12.0–15.0)
MCHC: 32.9 g/dL (ref 30.0–36.0)
MCHC: 33.3 g/dL (ref 30.0–36.0)
MCHC: 32.3 g/dL (ref 30.0–36.0)
MCHC: 32.9 g/dL (ref 30.0–36.0)
MCHC: 33.2 g/dL (ref 30.0–36.0)
MCV: 89.9 fL (ref 78.0–100.0)
MCV: 89.3 fL (ref 78.0–100.0)
MCV: 89.3 fL (ref 78.0–100.0)
Platelets: 183 10*3/uL (ref 150–400)
Platelets: 232 10*3/uL (ref 150–400)
Platelets: 181 10*3/uL (ref 150–400)
RBC: 2.54 MIL/uL — ABNORMAL LOW (ref 3.87–5.11)
RBC: 3.34 MIL/uL — ABNORMAL LOW (ref 3.87–5.11)
RBC: 3.97 MIL/uL (ref 3.87–5.11)
RBC: 3.27 MIL/uL — ABNORMAL LOW (ref 3.87–5.11)
RBC: 3.41 MIL/uL — ABNORMAL LOW (ref 3.87–5.11)
RBC: 3.7 MIL/uL — ABNORMAL LOW (ref 3.87–5.11)
RDW: 15.9 % — ABNORMAL HIGH (ref 11.5–15.5)
RDW: 16 % — ABNORMAL HIGH (ref 11.5–15.5)
RDW: 15.1 % (ref 11.5–15.5)
RDW: 15.6 % — ABNORMAL HIGH (ref 11.5–15.5)
RDW: 15.8 % — ABNORMAL HIGH (ref 11.5–15.5)
WBC: 11.9 10*3/uL — ABNORMAL HIGH (ref 4.0–10.5)
WBC: 9.5 10*3/uL (ref 4.0–10.5)
WBC: 6.4 10*3/uL (ref 4.0–10.5)

## 2010-07-07 LAB — DIFFERENTIAL
Eosinophils Absolute: 0 10*3/uL (ref 0.0–0.7)
Eosinophils Relative: 0 % (ref 0–5)
Lymphs Abs: 1.5 10*3/uL (ref 0.7–4.0)

## 2010-07-07 LAB — CULTURE, BLOOD (ROUTINE X 2): Culture: NO GROWTH

## 2010-07-07 LAB — URINE CULTURE
Colony Count: 55000
Special Requests: NEGATIVE

## 2010-07-07 LAB — GRAM STAIN: Gram Stain: NONE SEEN

## 2010-07-07 LAB — PROTEIN, URINE, 24 HOUR
Collection Interval-UPROT: 24 hours
Protein, 24H Urine: 4550 mg/d — ABNORMAL HIGH (ref 50–100)
Protein, Urine: 140 mg/dL

## 2010-07-07 LAB — POCT URINALYSIS DIP (DEVICE)
Bilirubin Urine: NEGATIVE
Glucose, UA: NEGATIVE mg/dL
Glucose, UA: NEGATIVE mg/dL
Nitrite: NEGATIVE
Nitrite: NEGATIVE
Urobilinogen, UA: 0.2 mg/dL (ref 0.0–1.0)

## 2010-07-07 LAB — RPR: RPR Ser Ql: NONREACTIVE

## 2010-07-07 LAB — CREATININE CLEARANCE, URINE, 24 HOUR
Collection Interval-CRCL: 24 hours
Urine Total Volume-CRCL: 3250 mL

## 2010-07-07 LAB — MAGNESIUM: Magnesium: 6.8 mg/dL (ref 1.5–2.5)

## 2010-07-08 LAB — POCT URINALYSIS DIP (DEVICE)
Bilirubin Urine: NEGATIVE
Bilirubin Urine: NEGATIVE
Bilirubin Urine: NEGATIVE
Bilirubin Urine: NEGATIVE
Glucose, UA: NEGATIVE mg/dL
Glucose, UA: NEGATIVE mg/dL
Glucose, UA: NEGATIVE mg/dL
Glucose, UA: NEGATIVE mg/dL
Hgb urine dipstick: NEGATIVE
Ketones, ur: NEGATIVE mg/dL
Ketones, ur: NEGATIVE mg/dL
Ketones, ur: NEGATIVE mg/dL
Ketones, ur: NEGATIVE mg/dL
Nitrite: NEGATIVE
Nitrite: NEGATIVE
Specific Gravity, Urine: 1.015 (ref 1.005–1.030)
Specific Gravity, Urine: <=1.005 (ref 1.005–1.030)
Urobilinogen, UA: 0.2 mg/dL (ref 0.0–1.0)

## 2010-07-09 LAB — POCT URINALYSIS DIP (DEVICE)
Glucose, UA: NEGATIVE mg/dL
Ketones, ur: NEGATIVE mg/dL
Ketones, ur: NEGATIVE mg/dL
Ketones, ur: NEGATIVE mg/dL
Protein, ur: 30 mg/dL — AB
Protein, ur: NEGATIVE mg/dL
Specific Gravity, Urine: 1.02 (ref 1.005–1.030)
Specific Gravity, Urine: 1.025 (ref 1.005–1.030)
pH: 5 (ref 5.0–8.0)

## 2010-07-09 LAB — GLUCOSE, CAPILLARY: Glucose-Capillary: 91 mg/dL (ref 70–99)

## 2010-07-10 LAB — POCT URINALYSIS DIP (DEVICE)
Bilirubin Urine: NEGATIVE
Bilirubin Urine: NEGATIVE
Glucose, UA: NEGATIVE mg/dL
Hgb urine dipstick: NEGATIVE
Ketones, ur: NEGATIVE mg/dL
Specific Gravity, Urine: 1.02 (ref 1.005–1.030)
pH: 5 (ref 5.0–8.0)

## 2010-07-11 LAB — POCT URINALYSIS DIP (DEVICE)
Glucose, UA: NEGATIVE mg/dL
Hgb urine dipstick: NEGATIVE
Ketones, ur: NEGATIVE mg/dL
Protein, ur: NEGATIVE mg/dL
Protein, ur: 30 mg/dL — AB
Specific Gravity, Urine: 1.025 (ref 1.005–1.030)
Specific Gravity, Urine: 1.02 (ref 1.005–1.030)
Urobilinogen, UA: 0.2 mg/dL (ref 0.0–1.0)
pH: 5.5 (ref 5.0–8.0)

## 2011-05-15 IMAGING — US US OB FOLLOW-UP
1 series · 14 of 28 positions shown · non-contrast
Comparison: none

OBSTETRICAL ULTRASOUND:
 This ultrasound was performed in The [HOSPITAL], and the [facility] report will be stored to [REDACTED] PACS.

[Series 1: us ob follow-up · 14 of 59 slices shown]
[im 3/59]
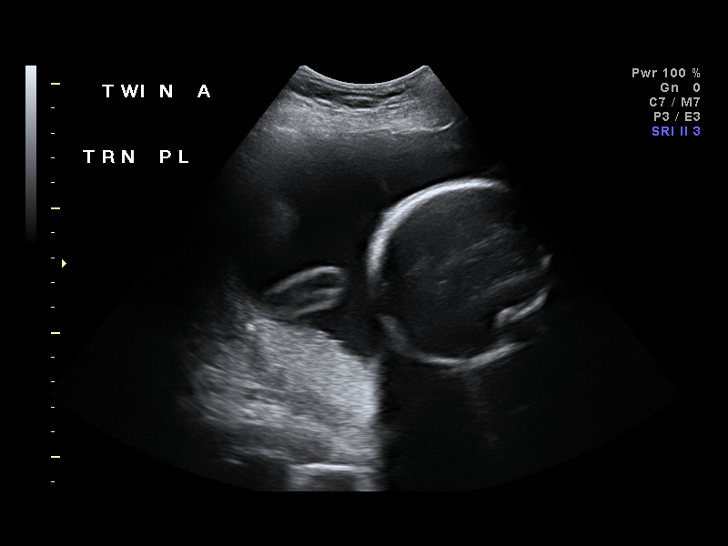
[im 7/59]
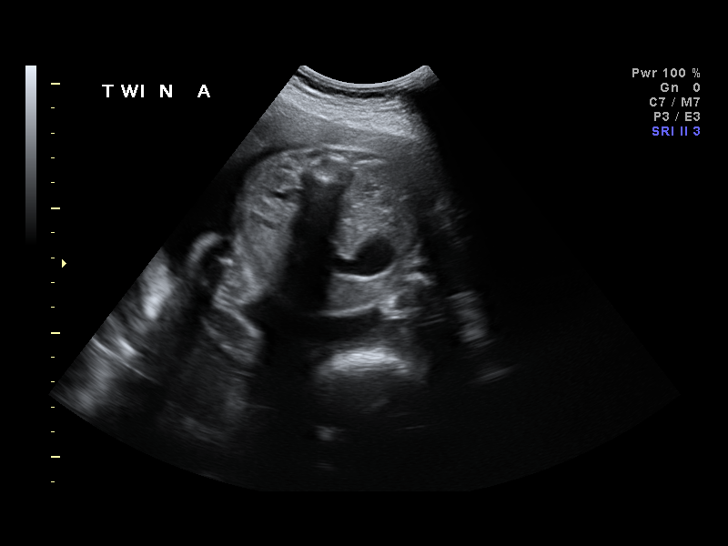
[im 11/59]
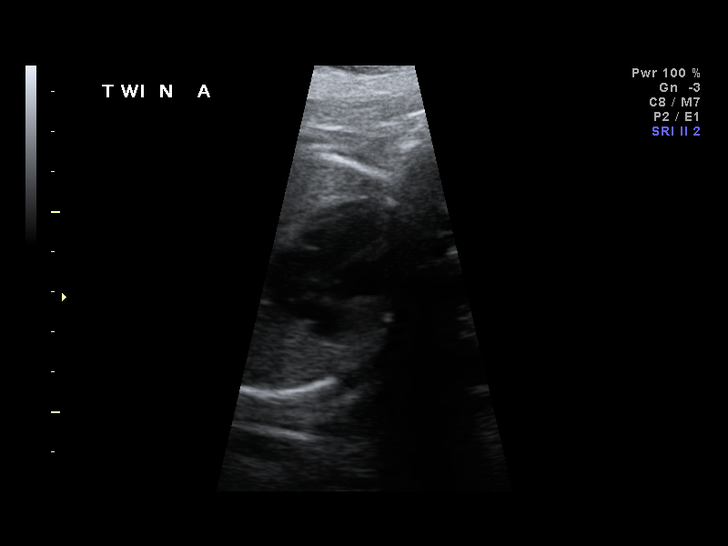
[im 16/59]
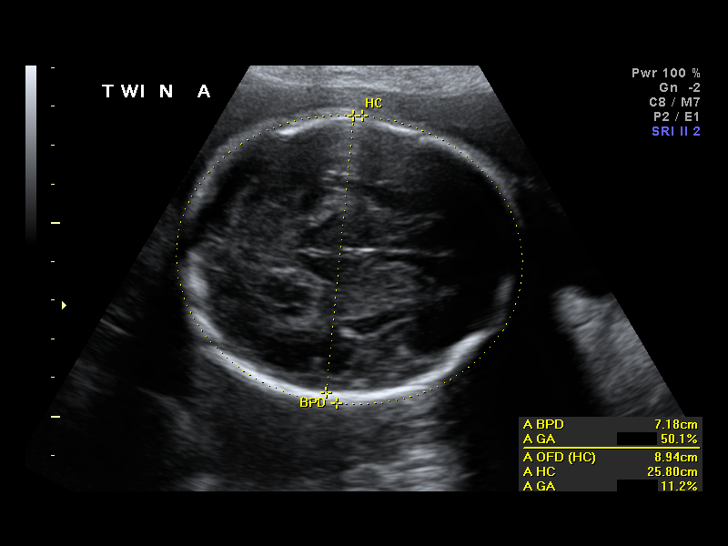
[im 20/59]
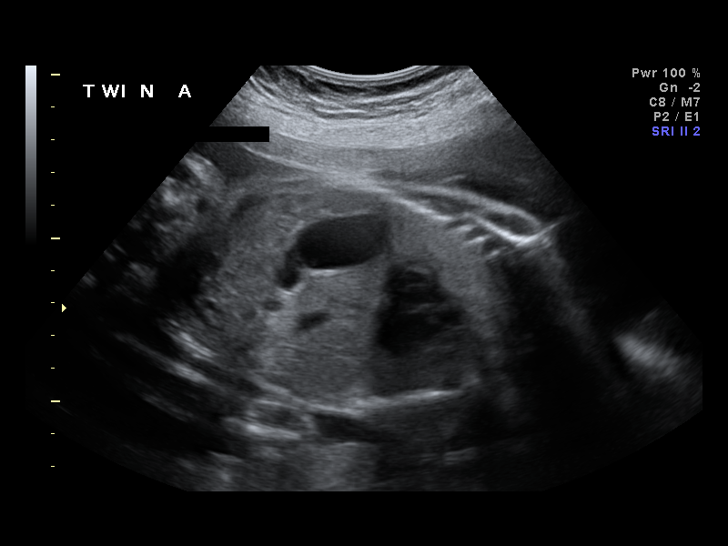
[im 24/59]
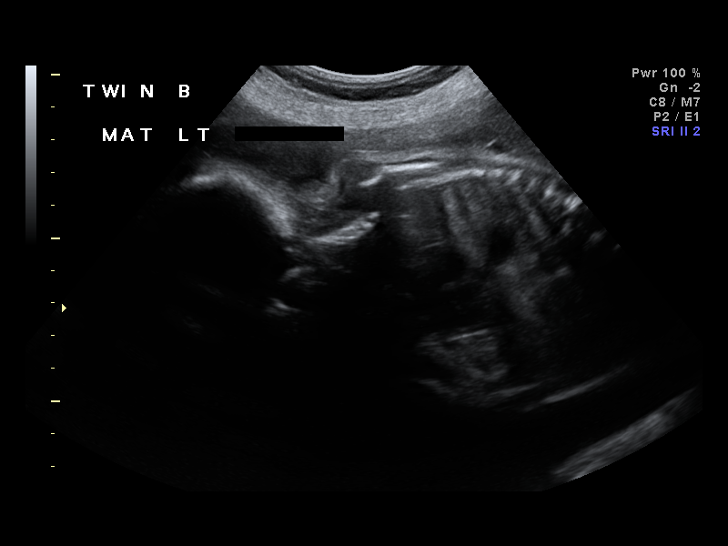
[im 28/59]
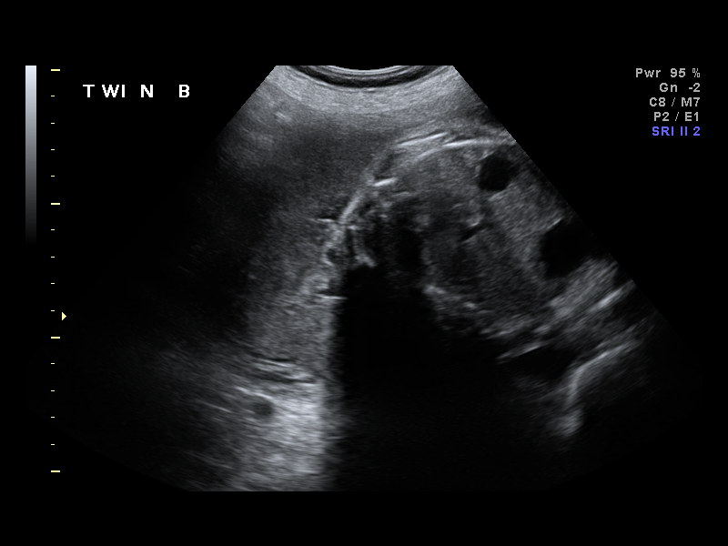
[im 33/59]
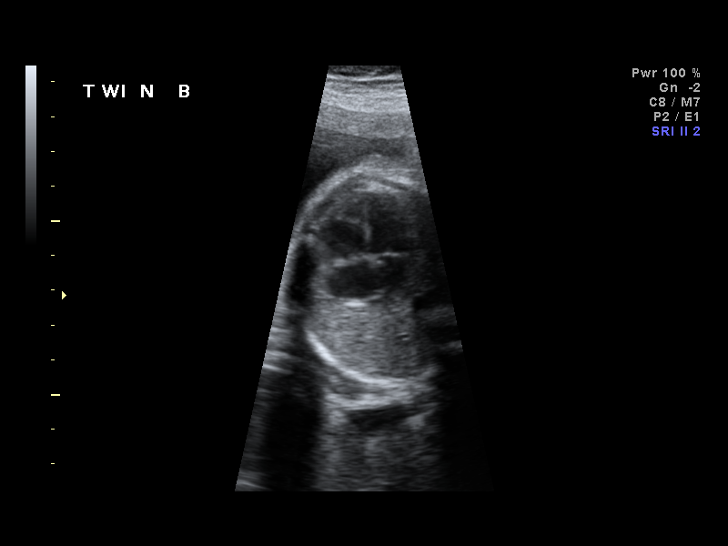
[im 37/59]
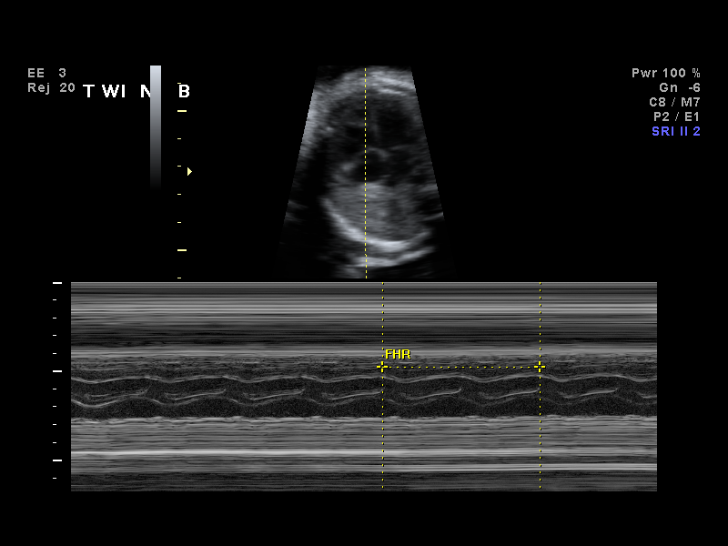
[im 41/59]
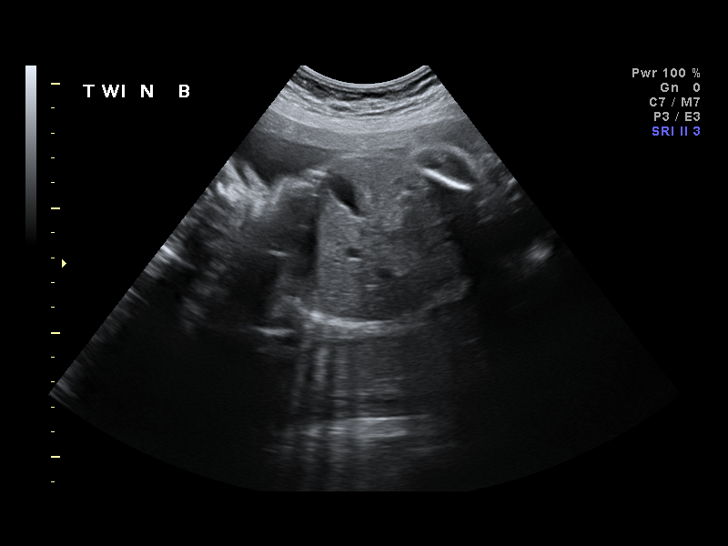
[im 46/59]
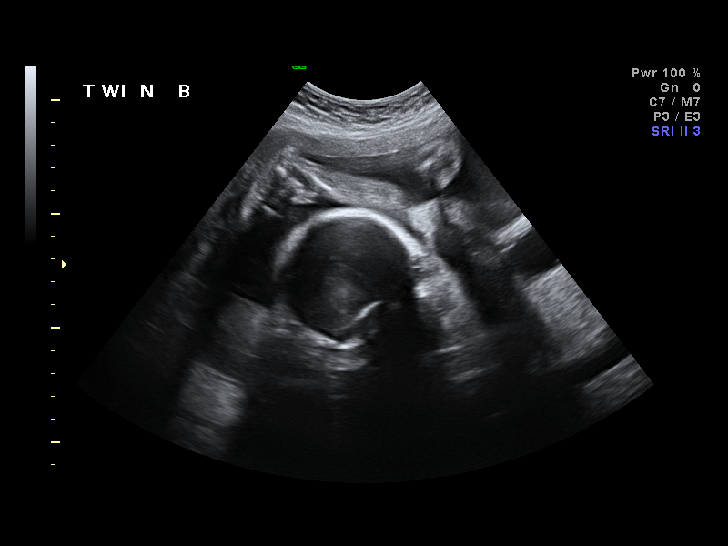
[im 50/59]
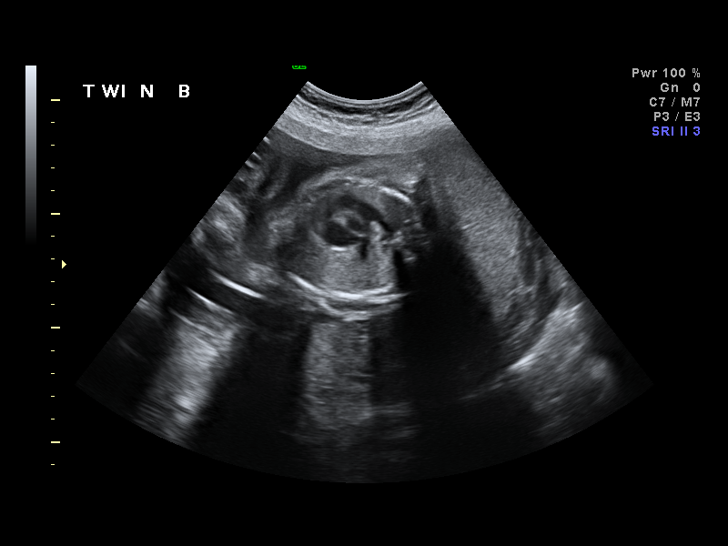
[im 54/59]
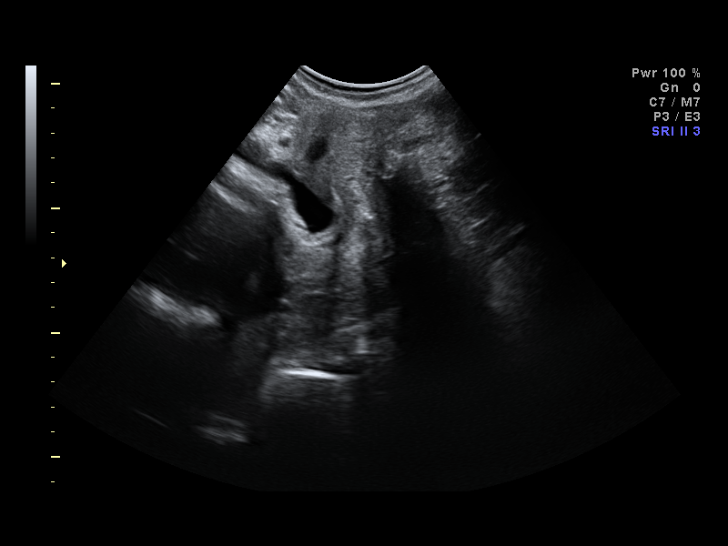
[im 59/59]
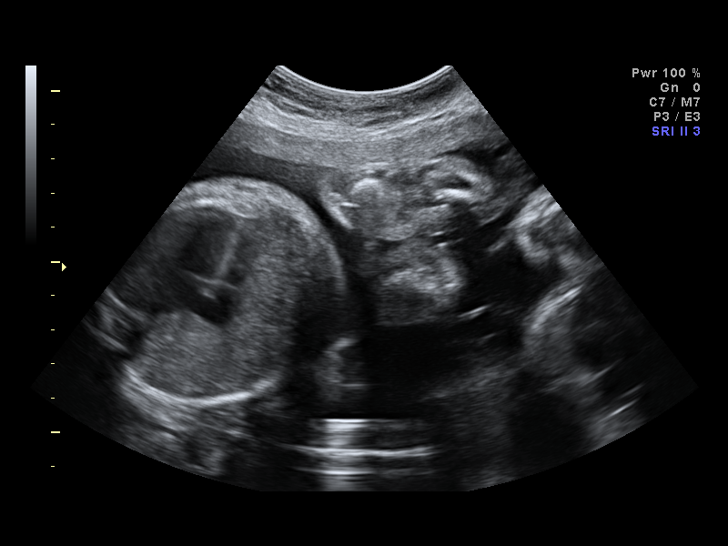

[14 of 28 positions shown; findings below may reference images not displayed]

IMPRESSION: [facility] has also been faxed to the ordering physician.

## 2011-06-05 IMAGING — US US OB FOLLOW-UP
1 series · 14 of 28 positions shown · non-contrast
Comparison: none

OBSTETRICAL ULTRASOUND:
 This ultrasound was performed in The [HOSPITAL], and the [facility] report will be stored to [REDACTED] PACS.  This report is also available in [HOSPITAL]?s access[facility]ware.

[Series 1: us ob follow-up · 43 acquisitions, 14 frames shown]
[im 2/43]
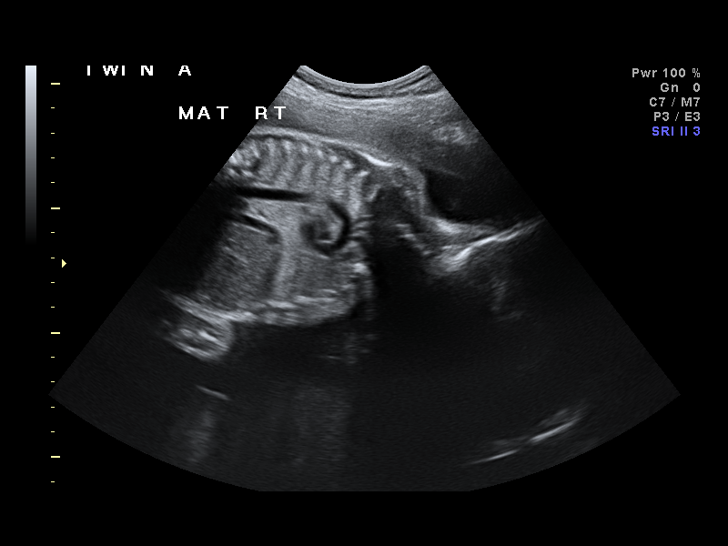
[im 5/43]
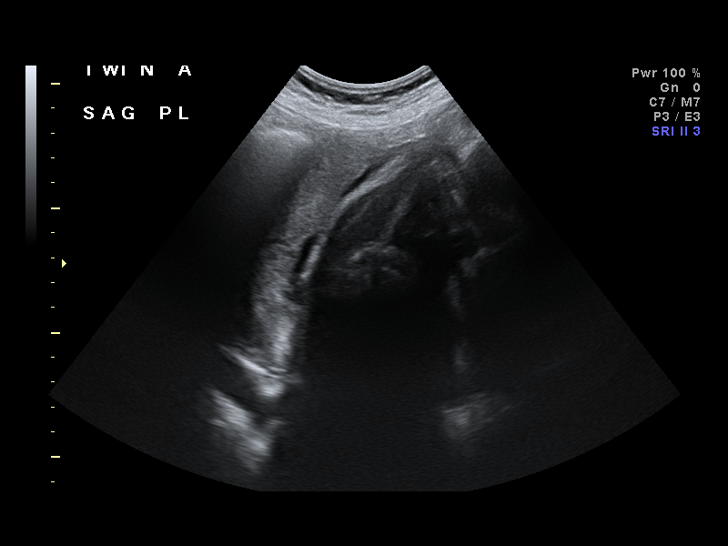
[im 8/43]
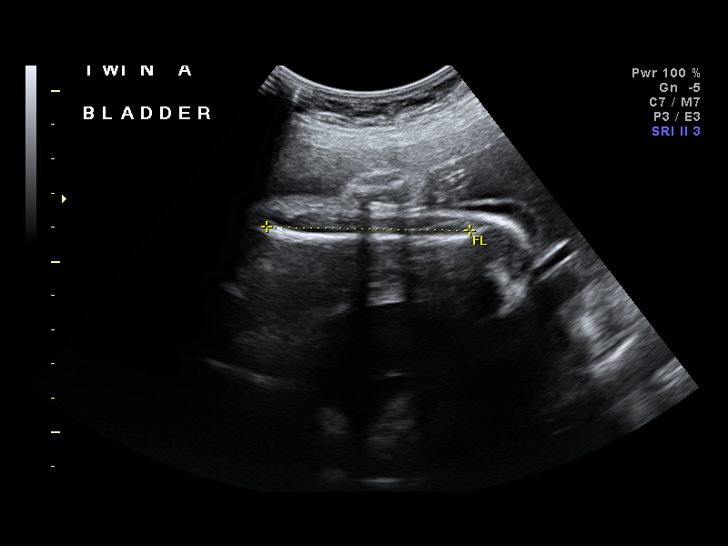
[im 11/43]
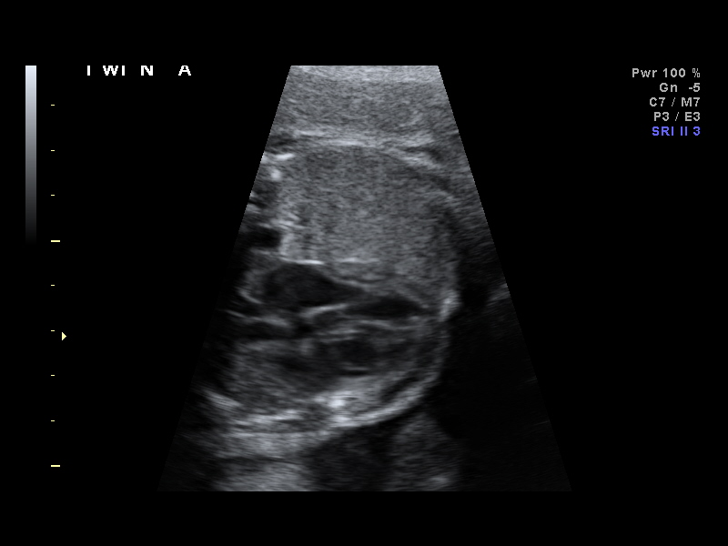
[im 15/43]
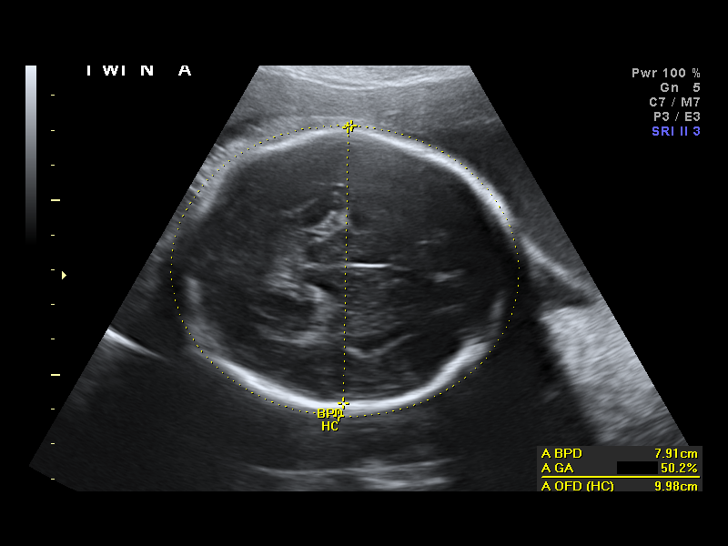
[im 18/43]
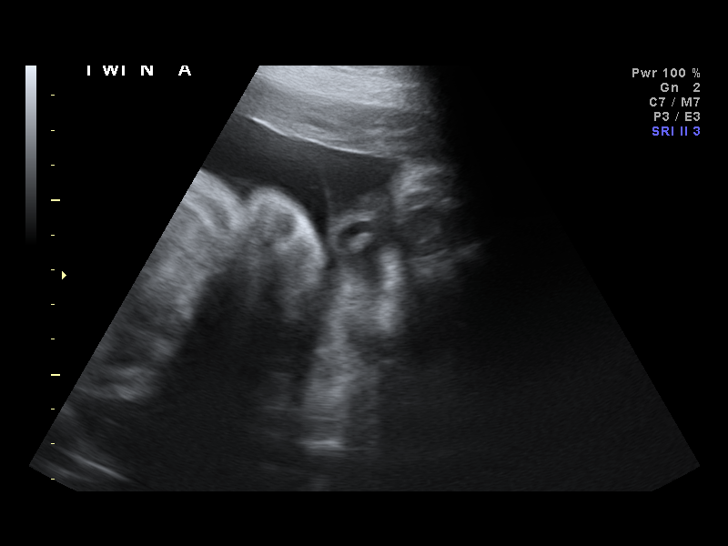
[im 21/43]
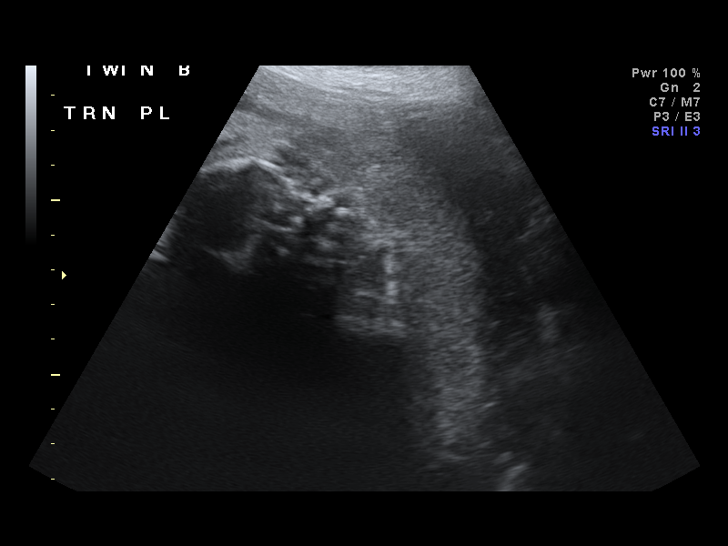
[im 24/43]
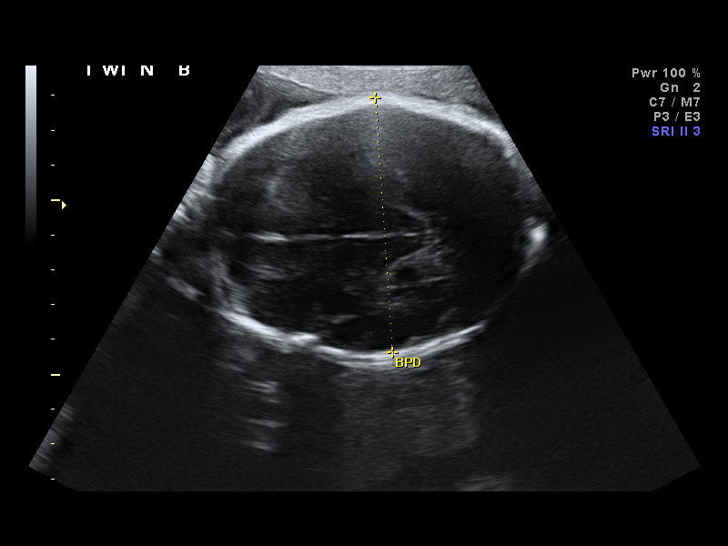
[im 27/43]
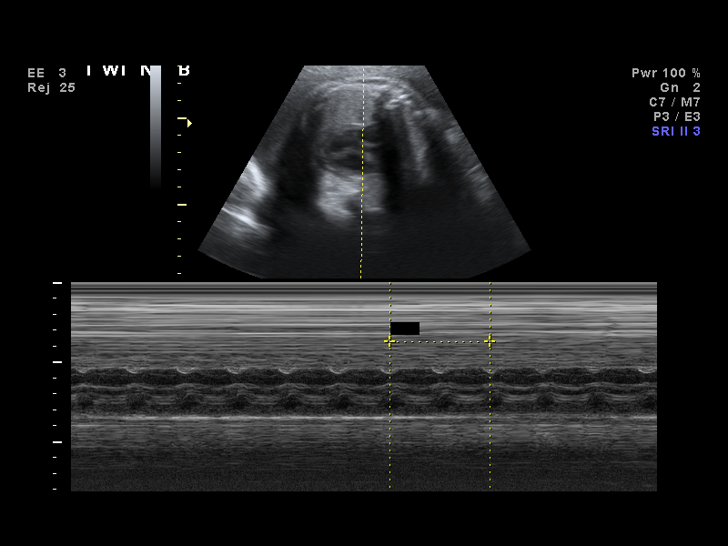
[im 30/43]
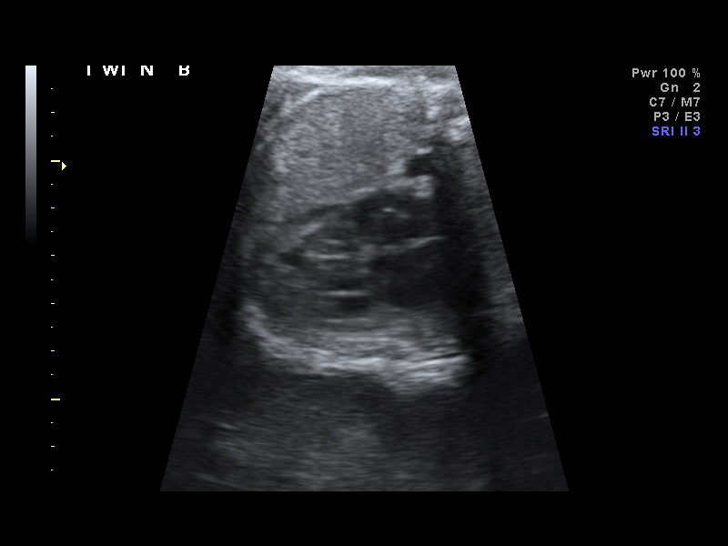
[im 33/43]
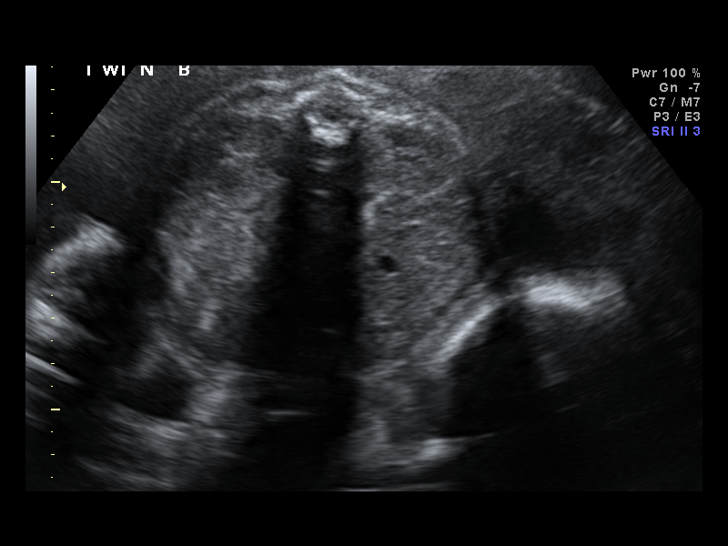
[im 36/43]
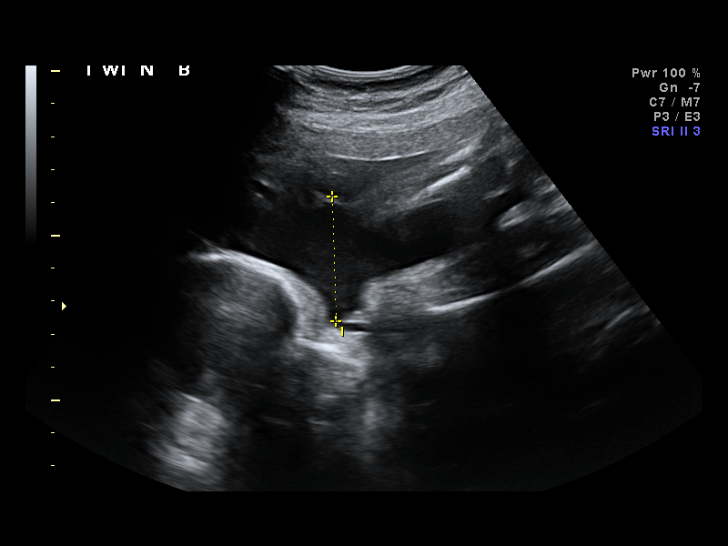
[im 39/43]
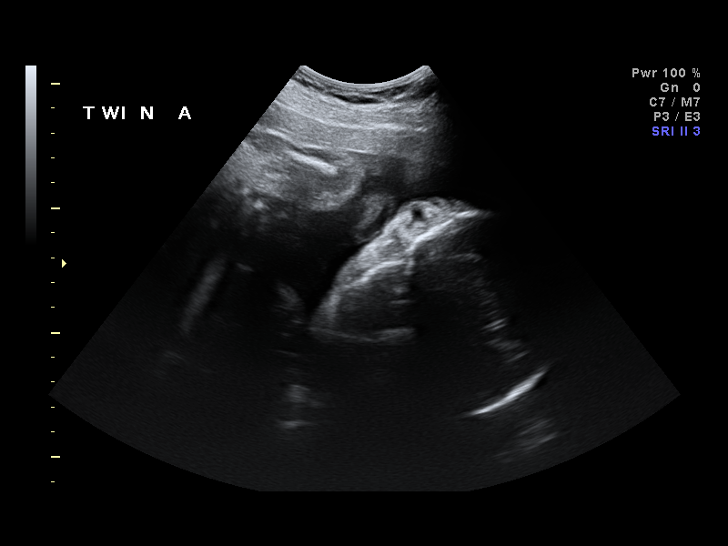
[im 43/43]
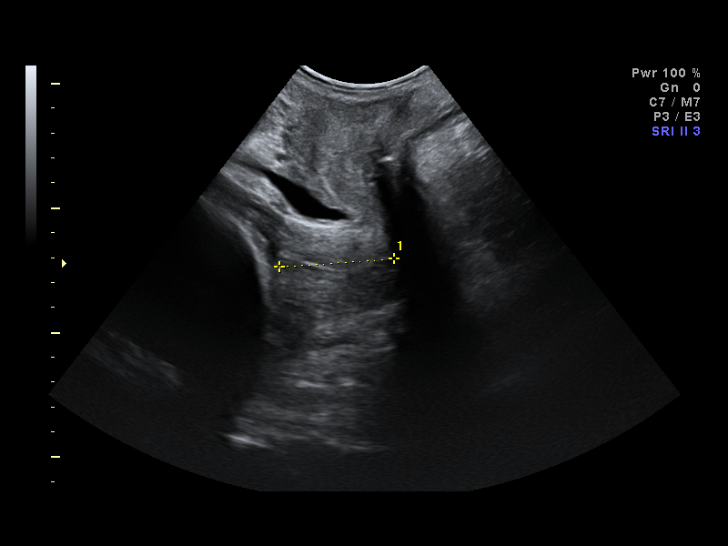

[14 of 28 positions shown; findings below may reference images not displayed]

IMPRESSION: [facility] has also been faxed to the ordering physician.

## 2019-10-03 HISTORY — PX: COLONOSCOPY: SHX174

## 2019-10-29 LAB — HM COLONOSCOPY

## 2020-04-04 DIAGNOSIS — I82409 Acute embolism and thrombosis of unspecified deep veins of unspecified lower extremity: Secondary | ICD-10-CM

## 2020-04-04 HISTORY — DX: Acute embolism and thrombosis of unspecified deep veins of unspecified lower extremity: I82.409

## 2021-04-04 HISTORY — PX: VAGINAL HYSTERECTOMY: SUR661

## 2022-03-24 DIAGNOSIS — R42 Dizziness and giddiness: Secondary | ICD-10-CM | POA: Diagnosis not present

## 2022-03-24 DIAGNOSIS — I70219 Atherosclerosis of native arteries of extremities with intermittent claudication, unspecified extremity: Secondary | ICD-10-CM | POA: Diagnosis not present

## 2022-03-24 DIAGNOSIS — D485 Neoplasm of uncertain behavior of skin: Secondary | ICD-10-CM | POA: Diagnosis not present

## 2022-03-24 DIAGNOSIS — R002 Palpitations: Secondary | ICD-10-CM | POA: Diagnosis not present

## 2022-03-24 DIAGNOSIS — M5459 Other low back pain: Secondary | ICD-10-CM | POA: Diagnosis not present

## 2022-03-24 DIAGNOSIS — Z0000 Encounter for general adult medical examination without abnormal findings: Secondary | ICD-10-CM | POA: Diagnosis not present

## 2022-03-24 DIAGNOSIS — H9113 Presbycusis, bilateral: Secondary | ICD-10-CM | POA: Diagnosis not present

## 2022-04-10 DIAGNOSIS — H5213 Myopia, bilateral: Secondary | ICD-10-CM | POA: Diagnosis not present

## 2022-04-14 ENCOUNTER — Ambulatory Visit: Payer: Medicaid Other | Admitting: Physician Assistant

## 2022-04-14 ENCOUNTER — Encounter: Payer: Self-pay | Admitting: Physician Assistant

## 2022-04-14 VITALS — BP 108/70 | HR 94 | Temp 98.0°F | Ht 67.0 in | Wt 155.4 lb

## 2022-04-14 DIAGNOSIS — G5603 Carpal tunnel syndrome, bilateral upper limbs: Secondary | ICD-10-CM | POA: Diagnosis not present

## 2022-04-14 DIAGNOSIS — F439 Reaction to severe stress, unspecified: Secondary | ICD-10-CM | POA: Diagnosis not present

## 2022-04-14 DIAGNOSIS — M159 Polyosteoarthritis, unspecified: Secondary | ICD-10-CM | POA: Diagnosis not present

## 2022-04-14 DIAGNOSIS — M79674 Pain in right toe(s): Secondary | ICD-10-CM | POA: Diagnosis not present

## 2022-04-14 DIAGNOSIS — Z59819 Housing instability, housed unspecified: Secondary | ICD-10-CM

## 2022-04-14 MED ORDER — CELECOXIB 100 MG PO CAPS: 100.0000 mg | ORAL_CAPSULE | Freq: Two times a day (BID) | ORAL | 2 refills | Status: AC

## 2022-04-14 NOTE — Progress Notes (Signed)
Subjective:    Decker ID: Sherry Decker, female    DOB: 06-Jun-1960, 62 y.o.   MRN: [id number]  Chief Complaint  Decker presents with   New Decker (Initial Visit)    New Decker in office to establish care with PCP; pt is wanting to discuss possible needed; osteo-arthritis pain keeping Decker awake and preventing sleep/ waking pt up; left hip and neck particularly; hands and wrists also problems; injection in R great toe recently, knee pain, Hx of Graves disease, not on medication in 5 yrs; check thyroid; due annual CPE and fasting labs;       HPI Sherry Decker presents today for new Decker establishment with me.  Decker was previously established with provider in [place]. Evacuated with family recently due to war.  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 Taking OTC medications for pain, but not helping well. -- dietary measures play a big role in inflammation levels Sherry Decker has noticed  -- R great toe still having about 7/10 pain daily, worse with walking; was told in [place] bone-on-bone and needing surgery  -- neck and back are also 'bad' with DJD   Carpal tunnel syndrome in both wrists - also diagnosed in [place] through EMG testing  Currently living in temporary housing with her family; found out they will be losing this in the next few months.   Past Medical History:  Diagnosis Date   Anemia    Dry eyes    DVT (deep venous thrombosis) (HCC)    Graves disease    Hyperthyroidism    Low blood pressure    Migraines    Osteoarthritis     Past Surgical History:  Procedure Laterality Date   ABDOMINAL HYSTERECTOMY      History reviewed. No pertinent family history.  Social History   Tobacco Use   Smoking status: Never   Smokeless  tobacco: Never  Vaping Use   Vaping Use: Never used  Substance Use Topics   Alcohol use: Not Currently   Drug use: Never     Not on File  Review of Systems NEGATIVE UNLESS OTHERWISE INDICATED IN HPI      Objective:     BP 108/70 (BP Location: Left Arm)   Pulse 94   Temp 98 F (36.7 C) (Temporal)   Ht 5\' 7"  (1.702 m)   Wt 155 lb 6.4 oz (70.5 kg)   LMP  (LMP Unknown)   SpO2 99%   BMI 24.34 kg/m   Wt Readings from Last 3 Encounters:  04/14/22 155 lb 6.4 oz (70.5 kg)  05/24/10 155 lb (70.3 kg)    BP Readings from Last 3 Encounters:  04/14/22 108/70  05/24/10 108/78     Physical Exam Vitals and nursing note reviewed.  Constitutional:      Appearance: Normal appearance. Sherry Decker is normal weight. Sherry Decker is not toxic-appearing.  HENT:     Head: Normocephalic and atraumatic.  Eyes:     Extraocular Movements: Extraocular movements intact.     Conjunctiva/sclera: Conjunctivae normal.     Pupils: Pupils are equal, round, and reactive to light.  Cardiovascular:     Rate and Rhythm: Normal rate and regular rhythm.     Pulses: Normal pulses.  Heart sounds: Normal heart sounds.  Pulmonary:     Effort: Pulmonary effort is normal.     Breath sounds: Normal breath sounds.  Musculoskeletal:        General: Normal range of motion.     Cervical back: Normal range of motion and neck supple.  Skin:    General: Skin is warm and dry.  Neurological:     General: No focal deficit present.     Mental Status: Sherry Decker is alert and oriented to person, place, and time.  Psychiatric:        Mood and Affect: Mood normal.        Behavior: Behavior normal.        Assessment & Plan:  Primary osteoarthritis involving multiple joints Assessment & Plan: Start on Celebrex 100 mg BID May take Tylenol up to 3000 mg daily Warm soaks, keep active; swimming Pt aware of risks vs benefits and possible adverse reactions    Bilateral carpal tunnel syndrome Assessment & Plan: Celebrex as  directed Stretches; wrist splints    Great toe pain, right Assessment & Plan: Referral to podiatry; pt states Sherry Decker was told Sherry Decker will need surgery on past eval with provider in [place]  Orders: -     Ambulatory referral to [place] insecurity -     AMB Referral to Managed Medicaid Care Management  Stress at home -     AMB Referral to Managed Medicaid Care Management  Other orders -     Celecoxib; Take 1 capsule (100 mg total) by mouth 2 (two) times daily.  Dispense: 60 capsule; Refill: 2        Return in about 6 weeks (around 05/26/2022) for recheck.  This note was prepared with assistance of Systems analyst. Occasional wrong-word or sound-a-like substitutions may have occurred due to the inherent limitations of voice recognition software.  Time Spent: 46 minutes of total time was spent on the date of the encounter performing the following actions: chart review prior to seeing the Decker, obtaining history, performing a medically necessary exam, counseling on the treatment plan, placing orders, and documenting in our EHR.       Deniah Saia M Seniah Lawrence, PA-C

## 2022-04-17 NOTE — Assessment & Plan Note (Signed)
Celebrex as directed Stretches; wrist splints

## 2022-04-17 NOTE — Assessment & Plan Note (Signed)
Referral to podiatry; pt states she was told she will need surgery on past eval with provider in [place]

## 2022-04-17 NOTE — Assessment & Plan Note (Signed)
Start on Celebrex 100 mg BID May take Tylenol up to 3000 mg daily Warm soaks, keep active; swimming Pt aware of risks vs benefits and possible adverse reactions

## 2022-04-20 ENCOUNTER — Encounter (HOSPITAL_COMMUNITY): Payer: Self-pay | Admitting: *Deleted

## 2022-04-20 ENCOUNTER — Other Ambulatory Visit: Payer: Self-pay

## 2022-04-20 ENCOUNTER — Emergency Department (HOSPITAL_COMMUNITY)
Admission: EM | Admit: 2022-04-20 | Discharge: 2022-04-21 | Disposition: A | Discharge status: Home / Self Care | Payer: Medicaid Other | Attending: Emergency Medicine | Admitting: Emergency Medicine

## 2022-04-20 DIAGNOSIS — J019 Acute sinusitis, unspecified: Secondary | ICD-10-CM | POA: Diagnosis not present

## 2022-04-20 DIAGNOSIS — R519 Headache, unspecified: Secondary | ICD-10-CM | POA: Diagnosis present

## 2022-04-20 DIAGNOSIS — Z20822 Contact with and (suspected) exposure to covid-19: Secondary | ICD-10-CM | POA: Diagnosis not present

## 2022-04-20 DIAGNOSIS — B9689 Other specified bacterial agents as the cause of diseases classified elsewhere: Secondary | ICD-10-CM

## 2022-04-20 LAB — RESP PANEL BY RT-PCR (RSV, FLU A&B, COVID)  RVPGX2
Influenza A by PCR: NEGATIVE
Influenza B by PCR: NEGATIVE
Resp Syncytial Virus by PCR: NEGATIVE
SARS Coronavirus 2 by RT PCR: NEGATIVE

## 2022-04-20 NOTE — ED Triage Notes (Signed)
The pt has a cold a headache and she thinks that she has a sinus infection for 3-4 dats

## 2022-04-20 NOTE — ED Provider Triage Note (Signed)
Emergency Medicine Provider Triage Evaluation Note  Sherry Decker , a 62 y.o. female  was evaluated in triage.  Pt complains of URI symptoms and headache for the past 3-4 days.  Patient states she believes she has a sinus infection.  Patient has history of prior R side orbital blow out fracture with remaining opening to sinuses.  She states when she last had sinus pressure, she ended up with periorbital cellulitis that required IV abx and hospitalization.  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  Denies fevers, but had night sweats.   Review of Systems  Positive: As above Negative: As above  Physical Exam  BP 103/71 (BP Location: Right Arm)   Pulse 63   Temp 98.5 F (36.9 C)   Resp 17   Ht 5\' 7"  (1.702 m)   Wt 70.5 kg   LMP  (LMP Unknown)   SpO2 99%   BMI 24.34 kg/m  Gen:   Awake, no distress   Resp:  Normal effort  MSK:   Moves extremities without difficulty  Other:  Maxillary sinus tenderness to palpation, congestion without obvious rhinorrhea  Medical Decision Making  Medically screening exam initiated at 8:47 PM.  Appropriate orders placed.  Harlan Vinal was informed that the remainder of the evaluation will be completed by another provider, this initial triage assessment does not replace that evaluation, and the importance of remaining in the ED until their evaluation is complete.     Theressa Stamps R, [place] 04/20/22 [phone]

## 2022-04-21 MED ORDER — AMOXICILLIN-POT CLAVULANATE 875-125 MG PO TABS: 1.0000 | ORAL_TABLET | Freq: Two times a day (BID) | ORAL | 0 refills | Status: DC

## 2022-04-21 NOTE — ED Notes (Signed)
Pt is here with sinus symptoms. Pt has old blow out fracture to left side and if not treated soon it can spread.  Pt states symptoms for 3 days, congestions. Pt alert and oriented and only complains of head pressure

## 2022-04-21 NOTE — ED Provider Notes (Signed)
[facility] EMERGENCY DEPARTMENT Provider Note   CSN: [id number] Arrival date & time: 04/20/22  1931     History  Chief Complaint  Patient presents with   Headache   URI    Sherry Decker is a 62 y.o. female.  62 year old female presents with concern for headache, congestion, facial pain, cough.  States she has a history of orbital blowout fracture with deviated septum which was never corrected surgically.  Patient was admitted a few years ago to a hospital in [place] due to a sinus infection which became an orbital infection requiring IV antibiotics.  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  Denies fevers.  Is concerned that if she does not get ahead of this infection, she will end up with a serious illness as she had in the past.       Home Medications Prior to Admission medications   Medication Sig Start Date End Date Taking? Authorizing Provider  amoxicillin-clavulanate (AUGMENTIN) 875-125 MG tablet Take 1 tablet by mouth every 12 (twelve) hours. 04/21/22  Yes Tacy Learn, PA-C  celecoxib (CELEBREX) 100 MG capsule Take 1 capsule (100 mg total) by mouth 2 (two) times daily. 04/14/22 05/14/22  Allwardt, Randa Evens, PA-C      Allergies    Patient has no known allergies.    Review of Systems   Review of Systems Negative except as per HPI Physical Exam Updated Vital Signs BP (!) 149/89 (BP Location: Right Arm)   Pulse 82   Temp 98.1 F (36.7 C)   Resp 20   Ht 5\' 7"  (1.702 m)   Wt 70.5 kg   LMP  (LMP Unknown)   SpO2 99%   BMI 24.34 kg/m  Physical Exam Vitals and nursing note reviewed.  Constitutional:      General: She is not in acute distress.    Appearance: She is well-developed. She is not diaphoretic.  HENT:     Head: Normocephalic and atraumatic.     Right Ear: Tympanic membrane and ear canal normal.     Left Ear: Tympanic membrane and ear canal normal.     Nose:     Right Sinus: Maxillary sinus tenderness and frontal sinus  tenderness present.     Left Sinus: Maxillary sinus tenderness and frontal sinus tenderness present.     Mouth/Throat:     Mouth: Mucous membranes are moist.  Eyes:     Conjunctiva/sclera: Conjunctivae normal.  Cardiovascular:     Rate and Rhythm: Normal rate and regular rhythm.     Heart sounds: Normal heart sounds.  Pulmonary:     Effort: Pulmonary effort is normal.     Breath sounds: Normal breath sounds.  Musculoskeletal:     Cervical back: Neck supple.  Lymphadenopathy:     Cervical: No cervical adenopathy.  Skin:    General: Skin is warm and dry.  Neurological:     Mental Status: She is alert and oriented to person, place, and time.  Psychiatric:        Behavior: Behavior normal.     ED Results / Procedures / Treatments   Labs (all labs ordered are listed, but only abnormal results are displayed) Labs Reviewed  RESP PANEL BY RT-PCR (RSV, FLU A&B, COVID)  RVPGX2    EKG None  Radiology No results found.  Procedures Procedures    Medications Ordered in ED Medications - No data to display  ED Course/ Medical Decision Making/ A&P  Medical Decision Making Risk Prescription drug management.   62 year old female with history of prior sinus trauma, prior admission for IV antibiotics due to complications from sinus infection presents with concern for facial pain, congestion and headache.  Is not feeling as ill today as she did requiring admission previously.  Patient is well-appearing on exam, has diffuse sinus tenderness, is afebrile, no tender cervical of adenopathy, steady gait.  Discussed with patient likely viral illness however due to her history and concern for complicating bacterial illness I feel it is reasonable to treat this patient with antibiotics.  She is requesting and is provided with referral to local ENT for follow-up.        Final Clinical Impression(s) / ED Diagnoses Final diagnoses:  Acute bacterial sinusitis     Rx / DC Orders ED Discharge Orders          Ordered    amoxicillin-clavulanate (AUGMENTIN) 875-125 MG tablet  Every 12 hours        04/21/22 1028              Roque Lias 04/21/22 1258    Fransico Meadow, MD 04/21/22 [phone]

## 2022-04-21 NOTE — Discharge Instructions (Addendum)
Antibiotics as prescribed and complete the full course. Follow up with ENT for history of blowout fracture and sinus infections.

## 2022-04-22 ENCOUNTER — Other Ambulatory Visit: Payer: Medicaid Other

## 2022-04-22 NOTE — Patient Outreach (Signed)
  Medicaid Managed Care Social Work Note  04/22/2022 Name:  Sherry Decker MRN:  [id number] DOB:  29-Jul-1960  Sherry Decker is an 62 y.o. year old female who is a primary patient of Decker, Sherry M, PA-C.  The Medicaid Managed Care Coordination team was consulted for assistance with:  Community Resources   Sherry Decker was given information about Medicaid Managed Care Coordination team services today. Sherry Decker Patient agreed to services and verbal consent obtained.  Engaged with patient  for by telephone forinitial visit in response to referral for case management and/or care coordination services.   Assessments/Interventions:  Review of past medical history, allergies, medications, health status, including review of consultants reports, laboratory and other test data, was performed as part of comprehensive evaluation and provision of chronic care management services.  SDOH: (Social Determinant of Health) assessments and interventions performed: BSW completed a telephone outreach with patient. She and her family evacuated from [place] in Oct 23 they are currently living in a home rent free and utility free but have to be out by 3/1. 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 Patient states she will start receiving social security on 3/1 for 1100 per month. Sherry Decker has transportation through his job. Patient has 62 year old twins. Patient states housing is the only issue right now. They do have a potential place to go but will need help with the utility deposits. BSW will send patient some housing resources and will reach out to [facility] [place]. Patient states she contacted them for assistance they called her and left a message, once she called back she left voicemail's but never received a telephone call back.   Advanced Directives Status:  Not addressed in this encounter.  Care Plan                 No Known  Allergies  Medications Reviewed Today     Reviewed by Sherry Decker, CMA (Certified Medical Assistant) on 04/14/22 at 40  Med List Status: <None>   Medication Order Taking? Sig Documenting Provider Last Dose Status Informant           No Medications to Display                            Patient Active Problem List   Diagnosis Date Noted   Primary osteoarthritis involving multiple joints 04/14/2022   Bilateral carpal tunnel syndrome 04/14/2022   Great toe pain, right 04/14/2022   ARTHRALGIA 10/23/2009   WRIST PAIN 10/23/2009   HAND PAIN 10/23/2009   HYPERTHYROIDISM 01/01/2009   GRAVES' DISEASE 11/13/2008    Conditions to be addressed/monitored per PCP order:   community resources  There are no care plans that you recently modified to display for this patient.   Follow up:  Patient agrees to Care Plan and Follow-up.  Plan: The Managed Medicaid care management team will reach out to the patient again over the next 7 days.  Date/time of next scheduled Social Work care management/care coordination outreach:  05/03/22  Sherry Decker, Arita Miss, [place] Medicaid Team  [phone]

## 2022-04-22 NOTE — Patient Instructions (Signed)
Visit Information  Ms. Darius was given information about Medicaid Managed Care team care coordination services as a part of their Healthy [facility] Medicaid benefit. Makayli Bracken verbally consented to engagement with the [facility] Managed Care team.   If you are experiencing a medical emergency, please call 911 or report to your local emergency department or urgent care.   If you have a non-emergency medical problem during routine business hours, please contact your provider's office and ask to speak with a nurse.   For questions related to your Healthy [facility] health plan, please call: [phone] or visit the homepage here: [email or web address]  If you would like to schedule transportation through your Healthy [facility] plan, please call the following number at least 2 days in advance of your appointment: [phone]  For information about your ride after you set it up, call Ride Assist at [phone]. Use this number to activate a Will Call pickup, or if your transportation is late for a scheduled pickup. Use this number, too, if you need to make a change or cancel a previously scheduled reservation.  If you need transportation services right away, call [phone]. The after-hours call center is staffed 24 hours to handle ride assistance and urgent reservation requests (including discharges) 365 days a year. Urgent trips include sick visits, hospital discharge requests and life-sustaining treatment.  Call the [place] at [phone], at any time, 24 hours a day, 7 days a week. If you are in danger or need immediate medical attention call 911.  If you would like help to quit smoking, call 1-800-QUIT-NOW 810-213-5926) OR Espaol: 1-855-Djelo-Ya (2-683-419-6222) o para ms informacin haga clic aqu or Text READY to 200-400 to register via text  Ms. Hanshaw - following are the goals we discussed in your visit today:   Goals  Addressed   None     Social Worker will follow up on 05/03/22 .   Mickel Fuchs, BSW, [place] Managed Medicaid Team  [phone]   Following is a copy of your plan of care:  There are no care plans that you recently modified to display for this patient.

## 2022-05-03 ENCOUNTER — Other Ambulatory Visit: Payer: Medicaid Other

## 2022-05-03 ENCOUNTER — Ambulatory Visit (INDEPENDENT_AMBULATORY_CARE_PROVIDER_SITE_OTHER): Payer: Medicaid Other | Admitting: Orthopedic Surgery

## 2022-05-03 ENCOUNTER — Ambulatory Visit (INDEPENDENT_AMBULATORY_CARE_PROVIDER_SITE_OTHER): Payer: Medicaid Other

## 2022-05-03 DIAGNOSIS — M79671 Pain in right foot: Secondary | ICD-10-CM

## 2022-05-03 DIAGNOSIS — M2021 Hallux rigidus, right foot: Secondary | ICD-10-CM

## 2022-05-03 DIAGNOSIS — M654 Radial styloid tenosynovitis [de Quervain]: Secondary | ICD-10-CM

## 2022-05-03 NOTE — Patient Outreach (Signed)
  Medicaid Managed Care Social Work Note  05/03/2022 Name:  Sherry Decker MRN:  [id number] DOB:  08/31/1960  Sherry Decker is an 62 y.o. year old female who is a primary patient of Allwardt, Alyssa M, PA-C.  The Medicaid Managed Care Coordination team was consulted for assistance with:  Community Resources   [name] was given information about Medicaid Managed Care Coordination team services today. Sherry Decker Patient agreed to services and verbal consent obtained.  Engaged with patient  for by telephone forfollow up visit in response to referral for case management and/or care coordination services.   Assessments/Interventions:  Review of past medical history, allergies, medications, health status, including review of consultants reports, laboratory and other test data, was performed as part of comprehensive evaluation and provision of chronic care management services.  SDOH: (Social Determinant of Health) assessments and interventions performed: BSW completed a telephone outreach with patient, She stated she was able to get the children's auditory test completed. They still have not found a new place that they can afford to move too. The place they have in mind needs a lot of repairs but the owner will give them a short term lease. Patient stated she still has not heard anything from [facility]. BSW placed a housing referral for housing and sent to [place] with [employer].  Advanced Directives Status:  Not addressed in this encounter.  Care Plan                 No Known Allergies  Medications Reviewed Today     Reviewed by Verlon Setting, CMA (Certified Medical Assistant) on 04/14/22 at 82  Med List Status: <None>   Medication Order Taking? Sig Documenting Provider Last Dose Status Informant           No Medications to Display                            Patient Active Problem List   Diagnosis Date Noted   Primary osteoarthritis involving multiple joints 04/14/2022    Bilateral carpal tunnel syndrome 04/14/2022   Great toe pain, right 04/14/2022   ARTHRALGIA 10/23/2009   WRIST PAIN 10/23/2009   HAND PAIN 10/23/2009   HYPERTHYROIDISM 01/01/2009   GRAVES' DISEASE 11/13/2008    Conditions to be addressed/monitored per PCP order:   community resources  There are no care plans that you recently modified to display for this patient.   Follow up:  Patient agrees to Care Plan and Follow-up.  Plan: The Managed Medicaid care management team will reach out to the patient again over the next 14 days.  Date/time of next scheduled Social Work care management/care coordination outreach:  05/23/22  Mickel Fuchs, Arita Miss, [place] Medicaid Team  [phone]

## 2022-05-03 NOTE — Patient Instructions (Signed)
Visit Information  Ms. Labella was given information about Medicaid Managed Care team care coordination services as a part of their Healthy [facility] Medicaid benefit. Hisayo Delossantos verbally consented to engagement with the [facility] Managed Care team.   If you are experiencing a medical emergency, please call 911 or report to your local emergency department or urgent care.   If you have a non-emergency medical problem during routine business hours, please contact your provider's office and ask to speak with a nurse.   For questions related to your Healthy [facility] health plan, please call: [phone] or visit the homepage here: [email or web address]  If you would like to schedule transportation through your Healthy [facility] plan, please call the following number at least 2 days in advance of your appointment: [phone]  For information about your ride after you set it up, call Ride Assist at [phone]. Use this number to activate a Will Call pickup, or if your transportation is late for a scheduled pickup. Use this number, too, if you need to make a change or cancel a previously scheduled reservation.  If you need transportation services right away, call [phone]. The after-hours call center is staffed 24 hours to handle ride assistance and urgent reservation requests (including discharges) 365 days a year. Urgent trips include sick visits, hospital discharge requests and life-sustaining treatment.  Call the [place] at [phone], at any time, 24 hours a day, 7 days a week. If you are in danger or need immediate medical attention call 911.  If you would like help to quit smoking, call 1-800-QUIT-NOW 818-029-9781) OR Espaol: 1-855-Djelo-Ya (1-443-154-0086) o para ms informacin haga clic aqu or Text READY to 200-400 to register via text  Ms. Haggard - following are the goals we discussed in your visit today:   Goals  Addressed   None      Social Worker will follow up on 05/23/22.   Mickel Fuchs, BSW, [place] Managed Medicaid Team  [phone]   Following is a copy of your plan of care:  There are no care plans that you recently modified to display for this patient.

## 2022-05-05 ENCOUNTER — Encounter: Payer: Self-pay | Admitting: Orthopedic Surgery

## 2022-05-05 NOTE — Progress Notes (Signed)
Office Visit Note   Patient: Sherry Decker           Date of Birth: 01-29-1961           MRN: [id number] Visit Date: 05/03/2022              Requested by: Allwardt, Randa Evens, PA-C [place],  [place] [address] PCP: Fredirick Lathe, PA-C  Chief Complaint  Patient presents with   Right Foot - Pain    Right great toe pain      HPI: Patient is a 62 year old woman who is seen for initial evaluation for pain in right great toe MTP joint which she states she has swelling and pain.  Patient states she has had trouble since this past summer.  She states that she did have a hyaluronic acid injection when she was in [place].  Patient states she has done therapy in the past she has used ibuprofen and Celebrex.  Patient complains of pain over the dorsum of the right thumb.  Assessment & Plan: Visit Diagnoses:  1. Pain in right foot   2. De Quervain's disease (tenosynovitis)   3. Hallux rigidus, right foot     Plan: Patient states she would like to follow-up for evaluation and injection of the de Quervain's tenosynovitis.  Recommended stiff soled shoes and Achilles stretching to unload the MTP joint.  Discussed surgical options including cheilectomy and fusion including risks and benefits.  Patient states she would like to proceed with a cheilectomy.  In general this should relieve 50 to 75% of her symptoms.  Follow-Up Instructions: No follow-ups on file.   Ortho Exam  Patient is alert, oriented, no adenopathy, well-dressed, normal affect, normal respiratory effort. Examination patient has good dorsiflexion of the ankle she has good pulses she has dorsiflexion of the great toe MTP joint of 0 degrees plantarflexion of 20 degrees.  Examination of the right wrist she has pain to palpation of the first dorsal extensor compartment pain with Finkelstein's test.  Imaging: No results found. No images are attached to the encounter.  Labs: Lab Results  Component Value Date    ESRSEDRATE 16 10/23/2009   LABURIC 6.5 02/09/2009   REPTSTATUS 02/24/2009 FINAL 02/18/2009   GRAMSTAIN  02/18/2009    NO WBC SEEN NO SQUAMOUS EPITHELIAL CELLS SEEN NO ORGANISMS SEEN   CULT NO GROWTH 5 DAYS 02/18/2009   LABORGA ESCHERICHIA COLI 02/18/2009     Lab Results  Component Value Date   ALBUMIN 1.5 (L) 02/20/2009   ALBUMIN 1.4 (L) 02/19/2009   ALBUMIN 1.6 (L) 02/18/2009    Lab Results  Component Value Date   MG (HH) 02/17/2009    7.0 CRITICAL RESULT CALLED TO, READ BACK BY AND VERIFIED WITH: DICKSON,N @2025  ON 111610 BY FLEMINGS   MG (HH) 02/17/2009    6.8 CRITICAL RESULT CALLED TO, READ BACK BY AND VERIFIED WITH: EARL,S AT 0450 ON 02/17/09 BY MITCHELLN RESULTS CONFIRMED BY MANUAL DILUTION   No results found for: "VD25OH"  No results found for: "PREALBUMIN"    Latest Ref Rng & Units 10/23/2009   11:27 AM 02/20/2009    8:25 AM 02/19/2009    5:51 AM  CBC EXTENDED  WBC 4.5 - 10.5 10*3/microliter 3.9  10.3  13.3   RBC 3.87 - 5.11 M/uL 4.48  2.54  2.93   Hemoglobin 12.0 - 15.0 g/dL 13.4  7.6  8.7   HCT 36.0 - 46.0 % 39.0  22.8  26.4   Platelets  150.0 - 400.0 K/uL 238.0  198  183   NEUT# 1.4 - 7.7 K/uL 2.1     Lymph# 0.7 - 4.0 K/uL 1.4        There is no height or weight on file to calculate BMI.  Orders:  Orders Placed This Encounter  Procedures   XR Foot Complete Right   No orders of the defined types were placed in this encounter.    Procedures: No procedures performed  Clinical Data: No additional findings.  ROS:  All other systems negative, except as noted in the HPI. Review of Systems  Objective: Vital Signs: LMP  (LMP Unknown)   Specialty Comments:  No specialty comments available.  PMFS History: Patient Active Problem List   Diagnosis Date Noted   Primary osteoarthritis involving multiple joints 04/14/2022   Bilateral carpal tunnel syndrome 04/14/2022   Great toe pain, right 04/14/2022   ARTHRALGIA 10/23/2009   WRIST PAIN  10/23/2009   HAND PAIN 10/23/2009   HYPERTHYROIDISM 01/01/2009   GRAVES' DISEASE 11/13/2008   Past Medical History:  Diagnosis Date   Anemia    Dry eyes    DVT (deep venous thrombosis) (HCC)    Graves disease    Hyperthyroidism    Low blood pressure    Migraines    Osteoarthritis     No family history on file.  Past Surgical History:  Procedure Laterality Date   ABDOMINAL HYSTERECTOMY     Social History   Occupational History   Not on file  Tobacco Use   Smoking status: Never   Smokeless tobacco: Never  Vaping Use   Vaping Use: Never used  Substance and Sexual Activity   Alcohol use: Not Currently   Drug use: Never   Sexual activity: Yes    Birth control/protection: Surgical

## 2022-05-16 DIAGNOSIS — R0683 Snoring: Secondary | ICD-10-CM | POA: Diagnosis not present

## 2022-05-16 DIAGNOSIS — R519 Headache, unspecified: Secondary | ICD-10-CM | POA: Diagnosis not present

## 2022-05-16 DIAGNOSIS — G4730 Sleep apnea, unspecified: Secondary | ICD-10-CM | POA: Diagnosis not present

## 2022-05-16 DIAGNOSIS — J343 Hypertrophy of nasal turbinates: Secondary | ICD-10-CM | POA: Diagnosis not present

## 2022-05-16 DIAGNOSIS — J3489 Other specified disorders of nose and nasal sinuses: Secondary | ICD-10-CM | POA: Diagnosis not present

## 2022-05-18 DIAGNOSIS — H05331 Deformity of right orbit due to trauma or surgery: Secondary | ICD-10-CM | POA: Diagnosis not present

## 2022-05-18 DIAGNOSIS — R519 Headache, unspecified: Secondary | ICD-10-CM | POA: Diagnosis not present

## 2022-05-18 DIAGNOSIS — J342 Deviated nasal septum: Secondary | ICD-10-CM | POA: Diagnosis not present

## 2022-05-19 ENCOUNTER — Other Ambulatory Visit: Payer: Self-pay | Admitting: Physician Assistant

## 2022-05-19 ENCOUNTER — Ambulatory Visit: Payer: Medicaid Other | Admitting: Physician Assistant

## 2022-05-19 ENCOUNTER — Encounter: Payer: Self-pay | Admitting: Physician Assistant

## 2022-05-19 VITALS — BP 102/72 | HR 74 | Temp 97.7°F | Ht 67.0 in | Wt 156.8 lb

## 2022-05-19 DIAGNOSIS — M79674 Pain in right toe(s): Secondary | ICD-10-CM

## 2022-05-19 DIAGNOSIS — M159 Polyosteoarthritis, unspecified: Secondary | ICD-10-CM

## 2022-05-19 DIAGNOSIS — Z59819 Housing instability, housed unspecified: Secondary | ICD-10-CM

## 2022-05-19 DIAGNOSIS — Z1231 Encounter for screening mammogram for malignant neoplasm of breast: Secondary | ICD-10-CM

## 2022-05-19 NOTE — Assessment & Plan Note (Signed)
Reminded patient to start on Celebrex 100 mg BID - don't take with other NSAIDs May take Tylenol up to 3000 mg daily Warm soaks, keep active; swimming Pt aware of risks vs benefits and possible adverse reactions

## 2022-05-19 NOTE — Assessment & Plan Note (Signed)
Situation is improving - housing secured for next month.

## 2022-05-19 NOTE — Progress Notes (Signed)
Subjective:    Patient ID: Sherry Decker, female    DOB: 1960/07/27, 62 y.o.   MRN: [id number]  Chief Complaint  Patient presents with   Follow-up    Pt in office for follow up for osteoarthritis; pt saw orthopedic and needs foot surgery for big toe joint; pt also saw ENT specialists and completed CT scan yesterday and recommended surgery to repair nose for sinus drainage; ordered sleep study and allergy testing; pt needs Pap Smear and will schedule for in office with PCP;     HPI Patient is in today for follow-up.  Dr. Sharol Given - will be doing surgery R foot  ENT - hx of R blow-out fracture - structural issue - will need surgery of maxillary sinus to prevent life-threatening sinus infections. ENT also strongly advised allergy testing - she will have this done in [facility] and has already been referred.   Housing is secured for next month, she is happy about this.    Past Medical History:  Diagnosis Date   Anemia    Dry eyes    DVT (deep venous thrombosis) (HCC)    Graves disease    Hyperthyroidism    Low blood pressure    Migraines    Osteoarthritis     Past Surgical History:  Procedure Laterality Date   ABDOMINAL HYSTERECTOMY      Family History  Problem Relation Age of Onset   Hyperthyroidism Mother        passed with patient was 59   Prostate cancer Father    CAD Father    Valvular heart disease Father    Skin cancer Father    Diabetes type II Sister        insulin pump; way worse after gestation diabetes   Testicular cancer Brother    Colon cancer Maternal Grandmother    Colon cancer Paternal Grandfather     Social History   Tobacco Use   Smoking status: Never   Smokeless tobacco: Never  Vaping Use   Vaping Use: Never used  Substance Use Topics   Alcohol use: Not Currently   Drug use: Never     No Known Allergies  Review of Systems NEGATIVE UNLESS OTHERWISE INDICATED IN HPI      Objective:     BP 102/72 (BP Location: Left Arm)   Pulse 74    Temp 97.7 F (36.5 C) (Temporal)   Ht 5' 7"$  (1.702 m)   Wt 156 lb 12.8 oz (71.1 kg)   LMP  (LMP Unknown)   SpO2 98%   BMI 24.56 kg/m   Wt Readings from Last 3 Encounters:  05/19/22 156 lb 12.8 oz (71.1 kg)  04/20/22 155 lb 6.8 oz (70.5 kg)  04/14/22 155 lb 6.4 oz (70.5 kg)    BP Readings from Last 3 Encounters:  05/19/22 102/72  04/21/22 (!) 149/89  04/14/22 108/70     Physical Exam Vitals and nursing note reviewed.  Constitutional:      Appearance: Normal appearance.  Cardiovascular:     Rate and Rhythm: Normal rate and regular rhythm.  Pulmonary:     Effort: Pulmonary effort is normal.  Neurological:     General: No focal deficit present.     Mental Status: She is alert and oriented to person, place, and time.  Psychiatric:        Mood and Affect: Mood normal.        Assessment & Plan:  Primary osteoarthritis involving multiple joints Assessment & Plan:  Reminded patient to start on Celebrex 100 mg BID - don't take with other NSAIDs May take Tylenol up to 3000 mg daily Warm soaks, keep active; swimming Pt aware of risks vs benefits and possible adverse reactions     Great toe pain, right Assessment & Plan: Following with Dr. Sharol Given   Housing insecurity [place]: Situation is improving - housing secured for next month.         Return in about 4 weeks (around 06/16/2022) for Well woman, fasting labs .  This note was prepared with assistance of Systems analyst. Occasional wrong-word or sound-a-like substitutions may have occurred due to the inherent limitations of voice recognition software.    Nykeem Citro M Keelan Pomerleau, PA-C

## 2022-05-19 NOTE — Assessment & Plan Note (Signed)
Following with Dr. Sharol Given

## 2022-05-23 ENCOUNTER — Other Ambulatory Visit: Payer: Medicaid Other

## 2022-05-23 NOTE — Patient Outreach (Signed)
  Medicaid Managed Care Social Work Note  05/23/2022 Name:  Sherry Decker MRN:  [id number] DOB:  Nov 27, 1960  Sherry Decker is an 62 y.o. year old female who is a primary patient of Allwardt, Alyssa M, PA-C.  The Medicaid Managed Care Coordination team was consulted for assistance with:  Community Resources   Ms. Tillett was given information about Medicaid Managed Care Coordination team services today. Sherry Decker Patient agreed to services and verbal consent obtained.  Engaged with patient  for by telephone forfollow up visit in response to referral for case management and/or care coordination services.   Assessments/Interventions:  Review of past medical history, allergies, medications, health status, including review of consultants reports, laboratory and other test data, was performed as part of comprehensive evaluation and provision of chronic care management services.  SDOH: (Social Determinant of Health) assessments and interventions performed: BSW completed a telephone outreach with patient and she stated a lot has changed since the last appointment. She states she has had some appointments and has to have some surgeries. She stated she did hear from [facility] They did find an apartment that they can rent for 6 months and they still have the house that they can move into by 3/1. Patient states they will have somewhere to move to by 3/1. Patient states they are getting ready to file taxes soon.No resources are needed at this time. No resources are needed at this time.  Advanced Directives Status:  Not addressed in this encounter.  Care Plan                 No Known Allergies  Medications Reviewed Today     Reviewed by Newt Minion, MD (Physician) on 05/05/22 at 1242  Med List Status: <None>   Medication Order Taking? Sig Documenting Provider Last Dose Status Informant  amoxicillin-clavulanate (AUGMENTIN) 875-125 MG tablet [id number]  Take 1 tablet by mouth every 12 (twelve) hours.  Tacy Learn, PA-C  Active   celecoxib (CELEBREX) 100 MG capsule [id number]  Take 1 capsule (100 mg total) by mouth 2 (two) times daily. Allwardt, Randa Evens, PA-C  Active             Patient Active Problem List   Diagnosis Date Noted   Housing insecurity 05/19/2022   Primary osteoarthritis involving multiple joints 04/14/2022   Bilateral carpal tunnel syndrome 04/14/2022   Great toe pain, right 04/14/2022   ARTHRALGIA 10/23/2009   WRIST PAIN 10/23/2009   HAND PAIN 10/23/2009   HYPERTHYROIDISM 01/01/2009   GRAVES' DISEASE 11/13/2008    Conditions to be addressed/monitored per PCP order:   community resources  There are no care plans that you recently modified to display for this patient.   Follow up:  Patient agrees to Care Plan and Follow-up.  Plan: The Managed Medicaid care management team will reach out to the patient again over the next 30-45 days.  Date/time of next scheduled Social Work care management/care coordination outreach:  06/24/22  Mickel Fuchs, Arita Miss, [place] Medicaid Team  [phone]

## 2022-05-23 NOTE — Patient Instructions (Signed)
Visit Information  [name] was given information about Medicaid Managed Care team care coordination services as a part of their Healthy [facility] Medicaid benefit. [name] verbally consented to engagement with the [facility] Managed Care team.   If you are experiencing a medical emergency, please call 911 or report to your local emergency department or urgent care.   If you have a non-emergency medical problem during routine business hours, please contact your provider's office and ask to speak with a nurse.   For questions related to your Healthy [facility] health plan, please call: [phone] or visit the homepage here: [email or web address]  If you would like to schedule transportation through your Healthy [facility] plan, please call the following number at least 2 days in advance of your appointment: [phone]  For information about your ride after you set it up, call Ride Assist at [phone]. Use this number to activate a Will Call pickup, or if your transportation is late for a scheduled pickup. Use this number, too, if you need to make a change or cancel a previously scheduled reservation.  If you need transportation services right away, call [phone]. The after-hours call center is staffed 24 hours to handle ride assistance and urgent reservation requests (including discharges) 365 days a year. Urgent trips include sick visits, hospital discharge requests and life-sustaining treatment.  Call the [facility] at [phone], at any time, 24 hours a day, 7 days a week. If you are in danger or need immediate medical attention call 911.  If you would like help to quit smoking, call 1-800-QUIT-NOW (516)282-2227) OR Espaol: 1-855-Djelo-Ya QO:409462) o para ms informacin haga clic aqu or Text READY to 200-400 to register via text  Sherry Decker - following are the goals we discussed in your visit today:   Goals  Addressed   None       Follow up in 30-45 days   Mickel Fuchs, [place], [place] Medicaid Team  [phone]   Following is a copy of your plan of care:  There are no care plans that you recently modified to display for this patient.

## 2022-05-24 ENCOUNTER — Ambulatory Visit: Payer: Medicaid Other

## 2022-06-16 ENCOUNTER — Other Ambulatory Visit: Payer: Self-pay | Admitting: Physician Assistant

## 2022-06-16 ENCOUNTER — Ambulatory Visit
Admission: RE | Admit: 2022-06-16 | Discharge: 2022-06-16 | Disposition: A | Discharge status: Home / Self Care | Payer: Medicaid Other | Source: Ambulatory Visit | Attending: Physician Assistant | Admitting: Physician Assistant

## 2022-06-16 DIAGNOSIS — Z1231 Encounter for screening mammogram for malignant neoplasm of breast: Secondary | ICD-10-CM

## 2022-06-16 DIAGNOSIS — N6320 Unspecified lump in the left breast, unspecified quadrant: Secondary | ICD-10-CM

## 2022-06-16 DIAGNOSIS — R922 Inconclusive mammogram: Secondary | ICD-10-CM | POA: Diagnosis not present

## 2022-06-16 DIAGNOSIS — N6489 Other specified disorders of breast: Secondary | ICD-10-CM | POA: Diagnosis not present

## 2022-06-17 ENCOUNTER — Other Ambulatory Visit (HOSPITAL_COMMUNITY)
Admission: RE | Admit: 2022-06-17 | Discharge: 2022-06-17 | Disposition: A | Discharge status: Home / Self Care | Payer: Medicaid Other | Source: Ambulatory Visit | Attending: Physician Assistant | Admitting: Physician Assistant

## 2022-06-17 ENCOUNTER — Encounter: Payer: Self-pay | Admitting: Physician Assistant

## 2022-06-17 ENCOUNTER — Ambulatory Visit (INDEPENDENT_AMBULATORY_CARE_PROVIDER_SITE_OTHER): Payer: Medicaid Other | Admitting: Physician Assistant

## 2022-06-17 VITALS — BP 94/62 | HR 61 | Temp 97.3°F | Ht 67.0 in | Wt 156.4 lb

## 2022-06-17 DIAGNOSIS — Z01419 Encounter for gynecological examination (general) (routine) without abnormal findings: Secondary | ICD-10-CM | POA: Insufficient documentation

## 2022-06-17 LAB — COMPREHENSIVE METABOLIC PANEL
ALT: 8 U/L (ref 0–35)
AST: 14 U/L (ref 0–37)
Albumin: 4.5 g/dL (ref 3.5–5.2)
Alkaline Phosphatase: 52 U/L (ref 39–117)
BUN: 19 mg/dL (ref 6–23)
CO2: 29 mEq/L (ref 19–32)
Calcium: 9.5 mg/dL (ref 8.4–10.5)
Chloride: 104 mEq/L (ref 96–112)
Creatinine, Ser: 0.84 mg/dL (ref 0.40–1.20)
GFR: 74.66 mL/min (ref >60.00)
Glucose, Bld: 107 mg/dL — ABNORMAL HIGH (ref 70–99)
Potassium: 4.3 mEq/L (ref 3.5–5.1)
Sodium: 141 mEq/L (ref 135–145)
Total Bilirubin: 0.6 mg/dL (ref 0.2–1.2)
Total Protein: 7 g/dL (ref 6.0–8.3)

## 2022-06-17 LAB — LIPID PANEL
Cholesterol: 209 mg/dL — ABNORMAL HIGH (ref 0–200)
HDL: 60.7 mg/dL (ref >39.00)
LDL Cholesterol: 119 mg/dL — ABNORMAL HIGH (ref 0–99)
NonHDL: 148.45
Total CHOL/HDL Ratio: 3
Triglycerides: 148 mg/dL (ref 0.0–149.0)
VLDL: 29.6 mg/dL (ref 0.0–40.0)

## 2022-06-17 LAB — CBC WITH DIFFERENTIAL/PLATELET
Basophils Absolute: 0 10*3/uL (ref 0.0–0.1)
Basophils Relative: 0.9 % (ref 0.0–3.0)
Eosinophils Absolute: 0.1 10*3/uL (ref 0.0–0.7)
Eosinophils Relative: 2.2 % (ref 0.0–5.0)
HCT: 38.9 % (ref 36.0–46.0)
Hemoglobin: 13.1 g/dL (ref 12.0–15.0)
Lymphocytes Relative: 34.5 % (ref 12.0–46.0)
Lymphs Abs: 1.3 10*3/uL (ref 0.7–4.0)
MCHC: 33.7 g/dL (ref 30.0–36.0)
MCV: 91.5 fl (ref 78.0–100.0)
Monocytes Absolute: 0.3 10*3/uL (ref 0.1–1.0)
Monocytes Relative: 8.1 % (ref 3.0–12.0)
Neutro Abs: 2 10*3/uL (ref 1.4–7.7)
Neutrophils Relative %: 54.3 % (ref 43.0–77.0)
Platelets: 239 10*3/uL (ref 150.0–400.0)
RBC: 4.25 Mil/uL (ref 3.87–5.11)
RDW: 13.5 % (ref 11.5–15.5)
WBC: 3.6 10*3/uL — ABNORMAL LOW (ref 4.0–10.5)

## 2022-06-17 LAB — TSH: TSH: 1.58 u[IU]/mL (ref 0.35–5.50)

## 2022-06-17 NOTE — Progress Notes (Signed)
Subjective:    Patient ID: Sherry Decker, female    DOB: December 31, 1960, 62 y.o.   MRN: [id number]  Chief Complaint  Patient presents with   Gynecologic Exam    Pt in the office for Well woman exam; recently had mammogram so breast exam not needed at this time; pt c/o mucus cough and post nasal drip for more than 3 months. Pt is going next month to see allergist;     HPI Patient is in today for well woman exam.  Patient reports that she has a history of abdominal hysterectomy and some type of reconstructive surgery in the female area, states it is complicated and she is not sure if she still has a cervix or not anymore.  Would like to go ahead with a Pap smear today.  She recently completed mammogram.  She is sexually active with her husband and no concerns there.  She would like to have fasting labs checked today.  Some stress at home still as they are struggling with housing and attempting to buy house at this time.  Family still adjusting from the evacuation from [place].  Kids do seem to be doing better as they are both seeing a specialist now.  Patient says that she has been eating worse lately and knows what she needs to do to feel better in that regard. Denies any HI or SI.   Past Medical History:  Diagnosis Date   Anemia    Dry eyes    DVT (deep venous thrombosis) (HCC)    Graves disease    Hyperthyroidism    Low blood pressure    Migraines    Osteoarthritis     Past Surgical History:  Procedure Laterality Date   ABDOMINAL HYSTERECTOMY      Family History  Problem Relation Age of Onset   Hyperthyroidism Mother        passed with patient was 75   Prostate cancer Father    CAD Father    Valvular heart disease Father    Skin cancer Father    Diabetes type II Sister        insulin pump; way worse after gestation diabetes   Testicular cancer Brother    Colon cancer Maternal Grandmother    Colon cancer Paternal Grandfather     Social History   Tobacco Use   Smoking  status: Never   Smokeless tobacco: Never  Vaping Use   Vaping Use: Never used  Substance Use Topics   Alcohol use: Not Currently   Drug use: Never     No Known Allergies  Review of Systems NEGATIVE UNLESS OTHERWISE INDICATED IN HPI      Objective:     BP 94/62 (BP Location: Left Arm)   Pulse 61   Temp (!) 97.3 F (36.3 C) (Temporal)   Ht 5\' 7"  (1.702 m)   Wt 156 lb 6.4 oz (70.9 kg)   LMP  (LMP Unknown)   SpO2 97%   BMI 24.50 kg/m   Wt Readings from Last 3 Encounters:  06/17/22 156 lb 6.4 oz (70.9 kg)  05/19/22 156 lb 12.8 oz (71.1 kg)  04/20/22 155 lb 6.8 oz (70.5 kg)    BP Readings from Last 3 Encounters:  06/17/22 94/62  05/19/22 102/72  04/21/22 (!) 149/89     Physical Exam Vitals and nursing note reviewed. Exam conducted with a chaperone present.  Constitutional:      Appearance: Normal appearance.  Eyes:     Extraocular  Movements: Extraocular movements intact.     Conjunctiva/sclera: Conjunctivae normal.     Pupils: Pupils are equal, round, and reactive to light.  Cardiovascular:     Rate and Rhythm: Normal rate and regular rhythm.     Pulses: Normal pulses.     Heart sounds: Normal heart sounds.  Pulmonary:     Effort: Pulmonary effort is normal.     Breath sounds: Normal breath sounds.  Abdominal:     General: Abdomen is flat. Bowel sounds are normal.     Palpations: Abdomen is soft.  Genitourinary:    General: Normal vulva.     Labia:        Right: No rash, tenderness or lesion.        Left: No rash, tenderness or lesion.      Vagina: Normal.     Uterus: Absent.      Comments: Could not completely visualize entire vaginal canal as patient was very tight / spasms, could not open speculum fully.  Neurological:     General: No focal deficit present.     Mental Status: She is alert and oriented to person, place, and time.  Psychiatric:        Mood and Affect: Mood normal.        Behavior: Behavior normal.        Thought Content: Thought  content normal.        Judgment: Judgment normal.        Assessment & Plan:  Well woman exam with routine gynecological exam -     Cytology - PAP -     CBC with Differential/Platelet -     Comprehensive metabolic panel -     Lipid panel -     TSH   Age-appropriate screening and counseling performed today. Will check labs and call with results. Preventive measures discussed and printed in AVS for patient.   Patient Counseling: [x]   Nutrition: Stressed importance of moderation in sodium/caffeine intake, saturated fat and cholesterol, caloric balance, sufficient intake of fresh fruits, vegetables, and fiber.  [x]   Stressed the importance of regular exercise.   []   Substance Abuse: Discussed cessation/primary prevention of tobacco, alcohol, or other drug use; driving or other dangerous activities under the influence; availability of treatment for abuse.   []   Injury prevention: Discussed safety belts, safety helmets, smoke detector, smoking near bedding or upholstery.   [x]   Sexuality: Discussed sexually transmitted diseases, partner selection, use of condoms, avoidance of unintended pregnancy  and contraceptive alternatives.   [x]   Dental health: Discussed importance of regular tooth brushing, flossing, and dental visits.  [x]   Health maintenance and immunizations reviewed. Please refer to Health maintenance section.       Return in about 6 months (around 12/18/2022) for recheck.  This note was prepared with assistance of Systems analyst. Occasional wrong-word or sound-a-like substitutions may have occurred due to the inherent limitations of voice recognition software.     Sherry Decker M Dene Landsberg, PA-C

## 2022-06-17 NOTE — Patient Instructions (Signed)
Wonderful to see you as always!  Labs & pap today - will call with results.  Keep working on those health goals. You're doing great!

## 2022-06-21 ENCOUNTER — Other Ambulatory Visit: Payer: Self-pay

## 2022-06-21 MED ORDER — CELECOXIB 100 MG PO CAPS: 100.0000 mg | ORAL_CAPSULE | Freq: Two times a day (BID) | ORAL | 2 refills | Status: DC

## 2022-06-23 LAB — CYTOLOGY - PAP
Comment: NEGATIVE
Diagnosis: NEGATIVE
High risk HPV: NEGATIVE

## 2022-06-24 ENCOUNTER — Other Ambulatory Visit: Payer: Medicaid Other

## 2022-06-24 NOTE — Patient Instructions (Signed)
  Medicaid Managed Care   Unsuccessful Outreach Note  06/24/2022 Name: Sherry Decker MRN: [id number] DOB: [birth year]  Referred by: Allwardt, Randa Evens, PA-C Reason for referral : High Risk Managed Medicaid (MM Social work telephone outreach)   An unsuccessful telephone outreach was attempted today. The patient was referred to the case management team for assistance with care management and care coordination.   Follow Up Plan: A HIPAA compliant phone message was left for the patient providing contact information and requesting a return call.   Mickel Fuchs, BSW, [place] Managed Medicaid Team  [phone]

## 2022-06-24 NOTE — Patient Outreach (Signed)
  Medicaid Managed Care   Unsuccessful Outreach Note  06/24/2022 Name: RUHEE TURNQUIST MRN: [id number] DOB: Apr 02, 1961  Referred by: Allwardt, Randa Evens, PA-C Reason for referral : High Risk Managed Medicaid (MM Social work telephone outreach)   An unsuccessful telephone outreach was attempted today. The patient was referred to the case management team for assistance with care management and care coordination.   Follow Up Plan: A HIPAA compliant phone message was left for the patient providing contact information and requesting a return call.   Mickel Fuchs, BSW, [place] Managed Medicaid Team  [phone]

## 2022-06-30 ENCOUNTER — Telehealth: Payer: Self-pay | Admitting: Orthopedic Surgery

## 2022-06-30 NOTE — Telephone Encounter (Signed)
Left message on patient's voicemail asking to call to scheduled right great toe cheilectomy. Provided name and direct telephone number for scheduling.

## 2022-07-05 ENCOUNTER — Ambulatory Visit: Payer: Medicaid Other

## 2022-07-29 ENCOUNTER — Telehealth: Payer: Self-pay | Admitting: Orthopedic Surgery

## 2022-07-29 NOTE — Telephone Encounter (Signed)
I called Sherry Decker to see if she was ready to schedule right great to cheilectomy.  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  She asked that I give her a call next month to see if she is ready to schedule then.

## 2022-08-22 ENCOUNTER — Other Ambulatory Visit (INDEPENDENT_AMBULATORY_CARE_PROVIDER_SITE_OTHER): Payer: Medicaid Other

## 2022-08-22 ENCOUNTER — Encounter: Payer: Self-pay | Admitting: Orthopedic Surgery

## 2022-08-22 ENCOUNTER — Ambulatory Visit: Payer: Medicaid Other | Admitting: Orthopedic Surgery

## 2022-08-22 DIAGNOSIS — M79642 Pain in left hand: Secondary | ICD-10-CM

## 2022-08-22 DIAGNOSIS — M79641 Pain in right hand: Secondary | ICD-10-CM

## 2022-08-22 DIAGNOSIS — M2021 Hallux rigidus, right foot: Secondary | ICD-10-CM | POA: Diagnosis not present

## 2022-08-22 NOTE — Progress Notes (Signed)
Office Visit Note   Patient: Sherry Decker           Date of Birth: 12-18-1960           MRN: [id number] Visit Date: 08/22/2022              Requested by: Allwardt, Crist Infante, PA-C [address] [place],  [place] [address] PCP: Bary Leriche, PA-C  Chief Complaint  Patient presents with   Right Foot - Follow-up    Pt sch for 09/16/2022 Cheilectomy      HPI: Patient is a 62 year old woman who presents for 2 separate issues.  She complains of pain at the base of the thumb bilaterally.  #2 hallux rigidus pain right great toe MTP joint.  Assessment & Plan: Visit Diagnoses:  1. Bilateral hand pain   2. Hallux rigidus, right foot     Plan: Recommended Voltaren gel and paraffin baths for both hands.  With the decreasing range of motion of the great toe patient would like to proceed with a fusion right great toe MTP joint.  Discussed that she would not be driving for 2 months after surgery.  Patient states she will call to try to move up her surgery.  Patient states she would like to try prednisone after surgery for her multiple arthritic symptoms.  Follow-Up Instructions: No follow-ups on file.   Ortho Exam  Patient is alert, oriented, no adenopathy, well-dressed, normal affect, normal respiratory effort. Examination of both hands patient has no pain to palpation of the scaphoid scapholunate or TFCC.  First dorsal extensor compartment is not tender to palpation she does have pain to palpation of the base of the thumb carpometacarpal joint bilaterally.  Examination of the right great toe MTP joint she has range of motion of 20 degrees she has a good pulse.  Patient has had no relief with Celebrex.  Imaging: XR Hand Complete Right  Result Date: 08/22/2022 Three-view radiographs of the right hand shows osteoarthritis right thumb base of the carpometacarpal joint.  Joint space narrowing worse on the right than left.  XR Hand Complete Left  Result Date: 08/22/2022 Three-view  radiographs of the left hand shows degenerative osteoarthritis base of the thumb carpometacarpal joint.  No images are attached to the encounter.  Labs: Lab Results  Component Value Date   ESRSEDRATE 16 10/23/2009   LABURIC 6.5 02/09/2009   REPTSTATUS 02/24/2009 FINAL 02/18/2009   GRAMSTAIN  02/18/2009    NO WBC SEEN NO SQUAMOUS EPITHELIAL CELLS SEEN NO ORGANISMS SEEN   CULT NO GROWTH 5 DAYS 02/18/2009   LABORGA ESCHERICHIA COLI 02/18/2009     Lab Results  Component Value Date   ALBUMIN 4.5 06/17/2022   ALBUMIN 1.5 (L) 02/20/2009   ALBUMIN 1.4 (L) 02/19/2009    Lab Results  Component Value Date   MG (HH) 02/17/2009    7.0 CRITICAL RESULT CALLED TO, READ BACK BY AND VERIFIED WITH: DICKSON,N @2025  ON 111610 BY FLEMINGS   MG (HH) 02/17/2009    6.8 CRITICAL RESULT CALLED TO, READ BACK BY AND VERIFIED WITH: EARL,S AT 0450 ON 02/17/09 BY MITCHELLN RESULTS CONFIRMED BY MANUAL DILUTION   No results found for: "VD25OH"  No results found for: "PREALBUMIN"    Latest Ref Rng & Units 06/17/2022    9:14 AM 10/23/2009   11:27 AM 02/20/2009    8:25 AM  CBC EXTENDED  WBC 4.0 - 10.5 K/uL 3.6  3.9  10.3   RBC 3.87 - 5.11 Mil/uL  4.25  4.48  2.54   Hemoglobin 12.0 - 15.0 g/dL 16.1  09.6  7.6   HCT 04.5 - 46.0 % 38.9  39.0  22.8   Platelets 150.0 - 400.0 K/uL 239.0  238.0  198   NEUT# 1.4 - 7.7 K/uL 2.0  2.1    Lymph# 0.7 - 4.0 K/uL 1.3  1.4       There is no height or weight on file to calculate BMI.  Orders:  Orders Placed This Encounter  Procedures   XR Hand Complete Right   XR Hand Complete Left   No orders of the defined types were placed in this encounter.    Procedures: No procedures performed  Clinical Data: No additional findings.  ROS:  All other systems negative, except as noted in the HPI. Review of Systems  Objective: Vital Signs: LMP  (LMP Unknown)   Specialty Comments:  No specialty comments available.  PMFS History: Patient Active Problem List    Diagnosis Date Noted   Housing insecurity 05/19/2022   Primary osteoarthritis involving multiple joints 04/14/2022   Bilateral carpal tunnel syndrome 04/14/2022   Great toe pain, right 04/14/2022   ARTHRALGIA 10/23/2009   WRIST PAIN 10/23/2009   HAND PAIN 10/23/2009   HYPERTHYROIDISM 01/01/2009   GRAVES' DISEASE 11/13/2008   Past Medical History:  Diagnosis Date   Anemia    Dry eyes    DVT (deep venous thrombosis) (HCC)    Graves disease    Hyperthyroidism    Low blood pressure    Migraines    Osteoarthritis     Family History  Problem Relation Age of Onset   Hyperthyroidism Mother        passed with patient was 2   Prostate cancer Father    CAD Father    Valvular heart disease Father    Skin cancer Father    Diabetes type II Sister        insulin pump; way worse after gestation diabetes   Testicular cancer Brother    Colon cancer Maternal Grandmother    Colon cancer Paternal Grandfather     Past Surgical History:  Procedure Laterality Date   ABDOMINAL HYSTERECTOMY     Social History   Occupational History   Not on file  Tobacco Use   Smoking status: Never   Smokeless tobacco: Never  Vaping Use   Vaping Use: Never used  Substance and Sexual Activity   Alcohol use: Not Currently   Drug use: Never   Sexual activity: Yes    Birth control/protection: Surgical

## 2022-09-15 ENCOUNTER — Other Ambulatory Visit: Payer: Self-pay

## 2022-09-15 ENCOUNTER — Encounter (HOSPITAL_COMMUNITY): Payer: Self-pay | Admitting: Orthopedic Surgery

## 2022-09-15 NOTE — Progress Notes (Signed)
Mrs Shimp denies chest pain or shortness of breath.  Patient denies having any s/s of Covid in her household, also denies any known exposure to Covid. Mrs. Coudriet denies  any s/s of upper or lower respiratory in the past 8 weeks.   Mrs. Garrow's PCP is Celso Amy, PA-C  Mrs Haberle had lots of questions regarding discharge instructions, patient reports that she only knows that she can't put foot down until follow up visit with Dr. Lajoyce Corners. I sent the questions/concerns to Santiago Glad, Dr. Audrie Lia scheduler.

## 2022-09-16 ENCOUNTER — Ambulatory Visit (HOSPITAL_COMMUNITY): Admission: RE | Admit: 2022-09-16 | Payer: Medicaid Other | Source: Home / Self Care | Admitting: Orthopedic Surgery

## 2022-09-16 HISTORY — DX: Other complications of anesthesia, initial encounter: T88.59XA

## 2022-09-16 HISTORY — DX: Thyrotoxicosis, unspecified with thyrotoxic crisis or storm: E05.91

## 2022-09-16 HISTORY — DX: Personal history of other medical treatment: Z92.89

## 2022-09-16 HISTORY — DX: Personal history of other (healed) physical injury and trauma: Z87.828

## 2022-09-16 HISTORY — DX: Other specified postprocedural states: Z98.890

## 2022-09-16 HISTORY — DX: Family history of other specified conditions: Z84.89

## 2022-09-16 HISTORY — DX: Nausea with vomiting, unspecified: R11.2

## 2022-09-16 SURGERY — FUSION, JOINT, GREAT TOE
Anesthesia: Choice | Site: Toe | Laterality: Right

## 2022-10-14 DIAGNOSIS — R0683 Snoring: Secondary | ICD-10-CM | POA: Diagnosis not present

## 2022-10-14 DIAGNOSIS — G4730 Sleep apnea, unspecified: Secondary | ICD-10-CM | POA: Diagnosis not present

## 2022-10-25 ENCOUNTER — Other Ambulatory Visit: Payer: Self-pay

## 2022-10-25 ENCOUNTER — Encounter (HOSPITAL_COMMUNITY): Payer: Self-pay | Admitting: Orthopedic Surgery

## 2022-10-25 NOTE — Anesthesia Preprocedure Evaluation (Signed)
Anesthesia Evaluation  Patient identified by MRN, date of birth, ID band  Reviewed: Allergy & Precautions, NPO status , Patient's Chart, lab work & pertinent test results  History of Anesthesia Complications (+) PONV and history of anesthetic complications  Airway Mallampati: II  TM Distance: >3 FB Neck ROM: Full    Dental no notable dental hx. (+) Teeth Intact, Dental Advisory Given   Pulmonary sleep apnea    Pulmonary exam normal breath sounds clear to auscultation       Cardiovascular + DVT  Normal cardiovascular exam Rhythm:Regular Rate:Normal     Neuro/Psych  Headaches    GI/Hepatic   Endo/Other   Hyperthyroidism   Renal/GU      Musculoskeletal  (+) Arthritis ,    Abdominal   Peds  Hematology Lab Results      Component                Value               Date                      WBC                      3.7 (L)             10/26/2022                HGB                      12.6                10/26/2022                HCT                      37.6                10/26/2022                MCV                      89.7                10/26/2022                PLT                      199                 10/26/2022              Anesthesia Other Findings   Reproductive/Obstetrics                             Anesthesia Physical Anesthesia Plan  ASA: 2  Anesthesia Plan: Regional   Post-op Pain Management: Regional block* and Minimal or no pain anticipated   Induction:   PONV Risk Score and Plan: 3 and Propofol infusion, Treatment may vary due to age or medical condition and Ondansetron  Airway Management Planned: Nasal Cannula, Natural Airway and Simple Face Mask  Additional Equipment: None  Intra-op Plan:   Post-operative Plan:   Informed Consent: I have reviewed the patients History and Physical, chart, labs and discussed the procedure including the risks, benefits and  alternatives for the proposed anesthesia with the  patient or authorized representative who has indicated his/her understanding and acceptance.     Dental advisory given  Plan Discussed with: CRNA  Anesthesia Plan Comments: (R Popliteal)        Anesthesia Quick Evaluation

## 2022-10-25 NOTE — Progress Notes (Signed)
PCP - Alyssa Allwardt, PA-C Cardiologist - n/a  Chest x-ray - n/a EKG - n/a Stress Test - n/a ECHO - n/a Cardiac Cath - n/a  ICD Pacemaker/Loop - n/a  Sleep Study -  n/a CPAP - none  Diabetes - n/a   ERAS: Clear liquids til 4:30 AM DOS  STOP now taking any Aspirin (unless otherwise instructed by your surgeon), Aleve, Naproxen, Ibuprofen, Motrin, Advil, Goody's, BC's, all herbal medications, fish oil, and all vitamins.   Coronavirus Screening Do you have any of the following symptoms:  Cough yes/no: No Fever (>100.54F)  yes/no: No Runny nose yes/no: No Sore throat yes/no: No Difficulty breathing/shortness of breath  yes/no: No  Have you traveled in the last 14 days and where? yes/no: No  Patient verbalized understanding of instructions that were given via phone.

## 2022-10-26 ENCOUNTER — Ambulatory Visit (HOSPITAL_BASED_OUTPATIENT_CLINIC_OR_DEPARTMENT_OTHER): Payer: Medicaid Other | Admitting: Registered Nurse

## 2022-10-26 ENCOUNTER — Encounter (HOSPITAL_COMMUNITY)
Admission: RE | Disposition: A | Discharge status: Home / Self Care | Payer: Self-pay | Source: Home / Self Care | Attending: Orthopedic Surgery

## 2022-10-26 ENCOUNTER — Other Ambulatory Visit: Payer: Self-pay

## 2022-10-26 ENCOUNTER — Ambulatory Visit (HOSPITAL_COMMUNITY): Payer: Medicaid Other | Admitting: Registered Nurse

## 2022-10-26 ENCOUNTER — Encounter (HOSPITAL_COMMUNITY): Payer: Self-pay | Admitting: Orthopedic Surgery

## 2022-10-26 ENCOUNTER — Ambulatory Visit (HOSPITAL_COMMUNITY)
Admission: RE | Admit: 2022-10-26 | Discharge: 2022-10-26 | Disposition: A | Discharge status: Home / Self Care | Payer: Medicaid Other | Attending: Orthopedic Surgery | Admitting: Orthopedic Surgery

## 2022-10-26 DIAGNOSIS — M2408 Loose body, other site: Secondary | ICD-10-CM | POA: Diagnosis not present

## 2022-10-26 DIAGNOSIS — G4730 Sleep apnea, unspecified: Secondary | ICD-10-CM | POA: Diagnosis not present

## 2022-10-26 DIAGNOSIS — M2021 Hallux rigidus, right foot: Secondary | ICD-10-CM

## 2022-10-26 DIAGNOSIS — M1990 Unspecified osteoarthritis, unspecified site: Secondary | ICD-10-CM | POA: Insufficient documentation

## 2022-10-26 DIAGNOSIS — Z86718 Personal history of other venous thrombosis and embolism: Secondary | ICD-10-CM | POA: Insufficient documentation

## 2022-10-26 DIAGNOSIS — M778 Other enthesopathies, not elsewhere classified: Secondary | ICD-10-CM | POA: Diagnosis not present

## 2022-10-26 HISTORY — DX: Dependence on wheelchair: Z99.3

## 2022-10-26 HISTORY — PX: CHEILECTOMY: SHX1336

## 2022-10-26 LAB — CBC
HCT: 37.6 % (ref 36.0–46.0)
Hemoglobin: 12.6 g/dL (ref 12.0–15.0)
MCH: 30.1 pg (ref 26.0–34.0)
MCHC: 33.5 g/dL (ref 30.0–36.0)
MCV: 89.7 fL (ref 80.0–100.0)
Platelets: 199 10*3/uL (ref 150–400)
RBC: 4.19 MIL/uL (ref 3.87–5.11)
RDW: 13.2 % (ref 11.5–15.5)
WBC: 3.7 10*3/uL — ABNORMAL LOW (ref 4.0–10.5)
nRBC: 0 % (ref 0.0–0.2)

## 2022-10-26 SURGERY — CHEILECTOMY
Anesthesia: Regional | Site: Foot | Laterality: Right

## 2022-10-26 MED ORDER — MIDAZOLAM HCL 2 MG/2ML IJ SOLN
INTRAMUSCULAR | Status: AC
  Filled 2022-10-26: qty 2

## 2022-10-26 MED ORDER — ONDANSETRON HCL 4 MG/2ML IJ SOLN
INTRAMUSCULAR | Status: DC | PRN
  Administered 2022-10-26: 4 mg via INTRAVENOUS

## 2022-10-26 MED ORDER — ONDANSETRON HCL 4 MG/2ML IJ SOLN
INTRAMUSCULAR | Status: AC
  Filled 2022-10-26: qty 2

## 2022-10-26 MED ORDER — FENTANYL CITRATE (PF) 100 MCG/2ML IJ SOLN: 25.0000 ug | INTRAMUSCULAR | Status: DC | PRN

## 2022-10-26 MED ORDER — BUPIVACAINE HCL (PF) 0.5 % IJ SOLN
INTRAMUSCULAR | Status: DC | PRN
  Administered 2022-10-26: 30 mL via PERINEURAL

## 2022-10-26 MED ORDER — PROPOFOL 10 MG/ML IV BOLUS
INTRAVENOUS | Status: DC | PRN
  Administered 2022-10-26: 30 mg via INTRAVENOUS
  Administered 2022-10-26: 100 ug/kg/min via INTRAVENOUS

## 2022-10-26 MED ORDER — ACETAMINOPHEN 10 MG/ML IV SOLN: 1000.0000 mg | Freq: Once | INTRAVENOUS | Status: DC | PRN

## 2022-10-26 MED ORDER — CEFAZOLIN SODIUM-DEXTROSE 2-4 GM/100ML-% IV SOLN
2.0000 g | INTRAVENOUS | Status: AC
  Administered 2022-10-26: 2 g via INTRAVENOUS
  Filled 2022-10-26: qty 100

## 2022-10-26 MED ORDER — 0.9 % SODIUM CHLORIDE (POUR BTL) OPTIME
TOPICAL | Status: DC | PRN
  Administered 2022-10-26: 1000 mL

## 2022-10-26 MED ORDER — CHLORHEXIDINE GLUCONATE 0.12 % MT SOLN
15.0000 mL | Freq: Once | OROMUCOSAL | Status: AC
  Administered 2022-10-26: 15 mL via OROMUCOSAL

## 2022-10-26 MED ORDER — EPHEDRINE SULFATE-NACL 50-0.9 MG/10ML-% IV SOSY
PREFILLED_SYRINGE | INTRAVENOUS | Status: DC | PRN
  Administered 2022-10-26 (×2): 5 mg via INTRAVENOUS

## 2022-10-26 MED ORDER — MIDAZOLAM HCL 2 MG/2ML IJ SOLN
INTRAMUSCULAR | Status: DC | PRN
  Administered 2022-10-26: 2 mg via INTRAVENOUS

## 2022-10-26 MED ORDER — ORAL CARE MOUTH RINSE: 15.0000 mL | Freq: Once | OROMUCOSAL | Status: AC

## 2022-10-26 MED ORDER — FENTANYL CITRATE (PF) 250 MCG/5ML IJ SOLN
INTRAMUSCULAR | Status: DC | PRN
  Administered 2022-10-26: 50 ug via INTRAVENOUS

## 2022-10-26 MED ORDER — LACTATED RINGERS IV SOLN: INTRAVENOUS | Status: DC

## 2022-10-26 MED ORDER — ONDANSETRON HCL 4 MG/2ML IJ SOLN: 4.0000 mg | Freq: Once | INTRAMUSCULAR | Status: DC | PRN

## 2022-10-26 MED ORDER — FENTANYL CITRATE (PF) 250 MCG/5ML IJ SOLN
INTRAMUSCULAR | Status: AC
  Filled 2022-10-26: qty 5

## 2022-10-26 MED ORDER — HYDROCODONE-ACETAMINOPHEN 5-325 MG PO TABS: 1.0000 | ORAL_TABLET | ORAL | 0 refills | Status: DC | PRN

## 2022-10-26 MED ORDER — EPHEDRINE 5 MG/ML INJ
INTRAVENOUS | Status: AC
  Filled 2022-10-26: qty 5

## 2022-10-26 SURGICAL SUPPLY — 49 items
BAG COUNTER SPONGE SURGICOUNT (BAG) ×1 IMPLANT
BAG SPNG CNTER NS LX DISP (BAG) ×1
BLADE AVERAGE 25X9 (BLADE) IMPLANT
BLADE MINI RND TIP GREEN BEAV (BLADE) IMPLANT
BNDG CMPR 5X6 CHSV STRCH STRL (GAUZE/BANDAGES/DRESSINGS)
BNDG CMPR 9X4 STRL LF SNTH (GAUZE/BANDAGES/DRESSINGS)
BNDG COHESIVE 1X5 TAN STRL LF (GAUZE/BANDAGES/DRESSINGS) IMPLANT
BNDG COHESIVE 6X5 TAN NS LF (GAUZE/BANDAGES/DRESSINGS) IMPLANT
BNDG COHESIVE 6X5 TAN ST LF (GAUZE/BANDAGES/DRESSINGS) IMPLANT
BNDG ESMARK 4X9 LF (GAUZE/BANDAGES/DRESSINGS) ×1 IMPLANT
BNDG GAUZE DERMACEA FLUFF 4 (GAUZE/BANDAGES/DRESSINGS) IMPLANT
BNDG GZE DERMACEA 4 6PLY (GAUZE/BANDAGES/DRESSINGS) ×1
CORD BIPOLAR FORCEPS 12FT (ELECTRODE) ×1 IMPLANT
COTTON STERILE ROLL (GAUZE/BANDAGES/DRESSINGS) IMPLANT
COVER SURGICAL LIGHT HANDLE (MISCELLANEOUS) ×2 IMPLANT
CUFF TOURN SGL QUICK 18X4 (TOURNIQUET CUFF) IMPLANT
CUFF TOURN SGL QUICK 24 (TOURNIQUET CUFF)
CUFF TRNQT CYL 24X4X16.5-23 (TOURNIQUET CUFF) IMPLANT
DRAPE OEC MINIVIEW 54X84 (DRAPES) IMPLANT
DRAPE U-SHAPE 47X51 STRL (DRAPES) ×1 IMPLANT
DRSG ADAPTIC 3X8 NADH LF (GAUZE/BANDAGES/DRESSINGS) IMPLANT
DURAPREP 26ML APPLICATOR (WOUND CARE) ×1 IMPLANT
ELECT REM PT RETURN 9FT ADLT (ELECTROSURGICAL) ×1
ELECTRODE REM PT RTRN 9FT ADLT (ELECTROSURGICAL) ×1 IMPLANT
GAUZE PAD ABD 7.5X8 STRL (GAUZE/BANDAGES/DRESSINGS) IMPLANT
GAUZE SPONGE 4X4 12PLY STRL (GAUZE/BANDAGES/DRESSINGS) IMPLANT
GLOVE BIOGEL PI IND STRL 9 (GLOVE) ×1 IMPLANT
GLOVE SURG ORTHO 9.0 STRL STRW (GLOVE) ×1 IMPLANT
GOWN STRL REUS W/ TWL XL LVL3 (GOWN DISPOSABLE) ×2 IMPLANT
GOWN STRL REUS W/TWL XL LVL3 (GOWN DISPOSABLE) ×2
KIT BASIN OR (CUSTOM PROCEDURE TRAY) ×1 IMPLANT
KIT TURNOVER KIT B (KITS) ×1 IMPLANT
MANIFOLD NEPTUNE II (INSTRUMENTS) ×1 IMPLANT
NDL HYPO 25GX1X1/2 BEV (NEEDLE) IMPLANT
NEEDLE HYPO 25GX1X1/2 BEV (NEEDLE) IMPLANT
NS IRRIG 1000ML POUR BTL (IV SOLUTION) ×1 IMPLANT
PACK ORTHO EXTREMITY (CUSTOM PROCEDURE TRAY) ×1 IMPLANT
PAD ARMBOARD 7.5X6 YLW CONV (MISCELLANEOUS) ×2 IMPLANT
PAD CAST 4YDX4 CTTN HI CHSV (CAST SUPPLIES) IMPLANT
PADDING CAST COTTON 4X4 STRL (CAST SUPPLIES)
SPECIMEN JAR SMALL (MISCELLANEOUS) ×1 IMPLANT
SUCTION TUBE FRAZIER 10FR DISP (SUCTIONS) IMPLANT
SUT ETHILON 2 0 FS 18 (SUTURE) IMPLANT
SUT VIC AB 2-0 FS1 27 (SUTURE) IMPLANT
SYR CONTROL 10ML LL (SYRINGE) IMPLANT
TOWEL GREEN STERILE (TOWEL DISPOSABLE) ×1 IMPLANT
TOWEL GREEN STERILE FF (TOWEL DISPOSABLE) ×1 IMPLANT
TUBE CONNECTING 12X1/4 (SUCTIONS) IMPLANT
WATER STERILE IRR 1000ML POUR (IV SOLUTION) ×1 IMPLANT

## 2022-10-26 NOTE — Anesthesia Postprocedure Evaluation (Signed)
Anesthesia Post Note  Patient: Sherry Decker  Procedure(s) Performed: RIGHT GREAT TOE CHEILECTOMY (Right: Foot)     Patient location during evaluation: PACU Anesthesia Type: Regional Level of consciousness: awake and alert Pain management: pain level controlled Vital Signs Assessment: post-procedure vital signs reviewed and stable Respiratory status: spontaneous breathing, nonlabored ventilation, respiratory function stable and patient connected to nasal cannula oxygen Cardiovascular status: stable and blood pressure returned to baseline Postop Assessment: no apparent nausea or vomiting Anesthetic complications: no   There were no known notable events for this encounter.  Last Vitals:  Vitals:   10/26/22 0830 10/26/22 0836  BP: 93/67   Pulse: 65 (!) 56  Resp: 12 13  Temp:  (!) 36.4 C  SpO2: 95% 96%    Last Pain:  Vitals:   10/26/22 0836  TempSrc:   PainSc: 0-No pain                 Trevor Iha

## 2022-10-26 NOTE — Transfer of Care (Signed)
Immediate Anesthesia Transfer of Care Note  Patient: Sherry Decker  Procedure(s) Performed: RIGHT GREAT TOE CHEILECTOMY (Right: Foot)  Patient Location: PACU  Anesthesia Type:MAC and Regional  Level of Consciousness: awake, alert , and oriented  Airway & Oxygen Therapy: Patient Spontanous Breathing  Post-op Assessment: Report given to RN and Post -op Vital signs reviewed and stable  Post vital signs: Reviewed and stable  Last Vitals:  Vitals Value Taken Time  BP 99/48   Temp    Pulse 82 10/26/22 0806  Resp 16 10/26/22 0806  SpO2 93 % 10/26/22 0806  Vitals shown include unfiled device data.  Last Pain:  Vitals:   10/26/22 0631  TempSrc:   PainSc: 5       Patients Stated Pain Goal: 1 (10/26/22 0631)  Complications: There were no known notable events for this encounter.

## 2022-10-26 NOTE — Anesthesia Procedure Notes (Signed)
Anesthesia Regional Block: Popliteal block   Pre-Anesthetic Checklist: , timeout performed,  Correct Patient, Correct Site, Correct Laterality,  Correct Procedure, Correct Position, site marked,  Risks and benefits discussed,  Pre-op evaluation,  At surgeon's request and post-op pain management  Laterality: Lower and Right  Prep: Maximum Sterile Barrier Precautions used, chloraprep       Needles:  Injection technique: Single-shot  Needle Type: Echogenic Needle     Needle Length: 9cm  Needle Gauge: 21     Additional Needles:   Procedures:,,,, ultrasound used (permanent image in chart),,    Narrative:  Start time: 10/26/2022 7:00 AM End time: 10/26/2022 7:07 AM Injection made incrementally with aspirations every 5 mL.  Performed by: Personally  Anesthesiologist: Trevor Iha, MD  Additional Notes: Block assessed. Patient tolerated procedure well.

## 2022-10-26 NOTE — Op Note (Signed)
10/26/2022  8:12 AM  PATIENT:  Sherry Decker    PRE-OPERATIVE DIAGNOSIS:  Hallux Rigidus Right Great Toe  POST-OPERATIVE DIAGNOSIS:  Same  PROCEDURE:  RIGHT GREAT TOE CHEILECTOMY  SURGEON:  Nadara Mustard, MD  PHYSICIAN ASSISTANT:None ANESTHESIA:   General  PREOPERATIVE INDICATIONS:  Sherry Decker is a  62 y.o. female with a diagnosis of Hallux Rigidus Right Great Toe who failed conservative measures and elected for surgical management.    The risks benefits and alternatives were discussed with the patient preoperatively including but not limited to the risks of infection, bleeding, nerve injury, cardiopulmonary complications, the need for revision surgery, among others, and the patient was willing to proceed.  OPERATIVE IMPLANTS:   * No implants in log *  @ENCIMAGES @  OPERATIVE FINDINGS: Patient had improvement from range of motion of 20 degrees dorsiflexion of  OPERATIVE PROCEDURE: 70 degrees.   DISCHARGE PLANNING:  Antibiotic duration: Patient brought the operating room after undergoing a regional block she then underwent a MAC anesthetic.  After close anesthesia obtained patient's right lower extremities prepped using DuraPrep draped into a sterile field a timeout was called.  A medial incision was made over the MTP joint this was carried down through the retinaculum which was elevated.  Patient had loose bodies within the joint these were removed.  The dorsal osteophytic bone spur was removed with a cheilectomy with an oscillating saw bone spurs from the base of the proximal phalanx were also removed.  Patient had good right improvement in the range of motion.  There was some mild degenerative changes of the articular surface.  The wound was irrigated with normal saline.  Synovectomy was performed.  The retinaculum was closed using 2-0 Vicryl skin was closed using 2-0 nylon a sterile dressing was applied patient was taken the PACU in stable condition.  Weightbearing: Touchdown  weightbearing on the right  Pain medication: Prescription for Vicodin  Dressing care/ Wound VAC: Dry dressing follow-up in the office 1 week to change  Ambulatory devices: Crutches  Discharge to: Home.  Follow-up: In the office 1 week post operative.

## 2022-10-26 NOTE — H&P (Signed)
Sherry Decker is an 62 y.o. female.   Chief Complaint: Hallux rigidus right great toe. HPI: Patient is a 62 year old woman with painful hallux rigidus right great toe.  Patient has undergone prolonged conservative therapy including anti-inflammatories stiff soled shoes Achilles stretching without relief.  Patient complains of pain with activities of daily living.  Initially recommended proceeding with fusion of the great toe MTP joint.  Patient's insurance approved a cheilectomy.  Past Medical History:  Diagnosis Date   Anemia    Dry eyes    pt uses eye drops   DVT (deep venous thrombosis) (HCC) 2022   DVT had drains, was on blood thinner hospitalized for 30 days, obtained a super bug that could noty be identified- was in [place].   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   Wheelchair dependent     Past Surgical History:  Procedure Laterality Date   Abdominaloplasty     BREAST REDUCTION SURGERY     COLONOSCOPY  10/2019   pelvic colon blader, colon repair      reversal of tubal ligation  2004   TUBAL LIGATION  1999   VAGINAL HYSTERECTOMY  2023    Family History  Problem Relation Age of Onset   Hyperthyroidism Mother        passed with patient was 70   Prostate cancer Father    CAD Father    Valvular heart disease Father    Skin cancer Father    Diabetes type II Sister        insulin pump; way worse after gestation diabetes   Testicular cancer Brother    Colon cancer Maternal Grandmother    Colon cancer Paternal Grandfather    Diabetes type II Paternal Grandmother     Social History:  reports that she has never smoked. She has never used smokeless tobacco. She reports that she does not currently use alcohol. She reports current drug use. Drug: Other-see comments.  Allergies:  Allergies  Allergen Reactions   Codeine Nausea And Vomiting   Penicillins Rash    Medications Prior to Admission  Medication Sig Dispense Refill   acetaminophen (TYLENOL) 500 MG tablet Take 1,500 mg by mouth every 6 (six) hours as needed (severe migraine headaches.).     Lifitegrast (XIIDRA) 5 % SOLN Place 1 drop into both eyes in the morning.     Multiple Vitamins-Minerals (EMERGEN-C IMMUNE PLUS PO) Take 1-2 tablets by mouth daily.     OVER THE COUNTER MEDICATION Take 1 drop by mouth daily as needed (severe migraine pain - patient obtained this in [place] where [facility] is legal). 1 drop has CBD oil 3.5 mg 1 drop has 3.5 mg of THC  Patient takes as last resort for migraines      Results for orders placed or performed during the hospital encounter of 10/26/22 (from the past 48 hour(s))  CBC per protocol     Status: Abnormal   Collection  Time: 10/26/22  6:03 AM  Result Value Ref Range   WBC 3.7 (L) 4.0 - 10.5 K/uL   RBC 4.19 3.87 - 5.11 MIL/uL   Hemoglobin 12.6 12.0 - 15.0 g/dL   HCT 16.1 09.6 - 04.5 %   MCV 89.7 80.0 - 100.0 fL   MCH 30.1 26.0 - 34.0 pg   MCHC 33.5 30.0 - 36.0 g/dL   RDW 40.9 81.1 - 91.4 %   Platelets 199 150 - 400 K/uL   nRBC 0.0 0.0 - 0.2 %    Comment: Performed at [facility], [address] [place], [place] [address]   No results found.  Review of Systems  All other systems reviewed and are negative.   Blood pressure 112/70, pulse 67, temperature 98.8 F (37.1 C), temperature source Oral, resp. rate 16, height 5\' 6"  (1.676 m), weight 63.5 kg, SpO2 99%. Physical Exam  Examination of right foot patient has palpable pulses.  She has pain to palpation and bony spurring over the MTP joint dorsally.  She has total range of motion of 20 degrees  of the right great toe.  She has no open ulcers. Assessment/Plan Assessment: Hallux rigidus right great toe.  Plan: Will plan for cheilectomy.  Risk and benefits were discussed including infection persistent pain need to proceed with a fusion.  Patient states she understands wished to proceed at this time.  Nadara Mustard, MD 10/26/2022, 6:35 AM

## 2022-10-27 ENCOUNTER — Encounter (HOSPITAL_COMMUNITY): Payer: Self-pay | Admitting: Orthopedic Surgery

## 2022-10-31 ENCOUNTER — Other Ambulatory Visit: Payer: Self-pay | Admitting: Physician Assistant

## 2022-11-10 ENCOUNTER — Encounter: Payer: Self-pay | Admitting: Orthopedic Surgery

## 2022-11-10 ENCOUNTER — Ambulatory Visit (INDEPENDENT_AMBULATORY_CARE_PROVIDER_SITE_OTHER): Payer: Medicaid Other | Admitting: Orthopedic Surgery

## 2022-11-10 DIAGNOSIS — M2021 Hallux rigidus, right foot: Secondary | ICD-10-CM

## 2022-11-10 NOTE — Progress Notes (Signed)
Office Visit Note   Patient: Sherry Decker           Date of Birth: [birth year]           MRN: [id number] Visit Date: 11/10/2022              Requested by: Allwardt, Crist Infante, PA-C [address] [place],  [place] [address] PCP: Bary Leriche, PA-C  Chief Complaint  Patient presents with   Right Foot - Routine Post Op    10/26/2022 right GT cheilectomy       HPI: Patient is a 62 year old woman who is 2 weeks status post right great toe cheilectomy.  Assessment & Plan: Visit Diagnoses:  1. Hallux rigidus, right foot     Plan: Patient will work on scar massage and work on range of motion.  Follow-Up Instructions: Return in about 2 weeks (around 11/24/2022).   Ortho Exam  Patient is alert, oriented, no adenopathy, well-dressed, normal affect, normal respiratory effort. Examination incision is well-approximated sutures are harvested.  Patient does have swelling she will begin scar massage and range of motion.  Imaging: No results found. No images are attached to the encounter.  Labs: Lab Results  Component Value Date   ESRSEDRATE 16 10/23/2009   LABURIC 6.5 02/09/2009   REPTSTATUS 02/24/2009 FINAL 02/18/2009   GRAMSTAIN  02/18/2009    NO WBC SEEN NO SQUAMOUS EPITHELIAL CELLS SEEN NO ORGANISMS SEEN   CULT NO GROWTH 5 DAYS 02/18/2009   LABORGA ESCHERICHIA COLI 02/18/2009     Lab Results  Component Value Date   ALBUMIN 4.5 06/17/2022   ALBUMIN 1.5 (L) 02/20/2009   ALBUMIN 1.4 (L) 02/19/2009    Lab Results  Component Value Date   MG (HH) 02/17/2009    7.0 CRITICAL RESULT CALLED TO, READ BACK BY AND VERIFIED WITH: DICKSON,N @2025  ON 111610 BY FLEMINGS   MG (HH) 02/17/2009    6.8 CRITICAL RESULT CALLED TO, READ BACK BY AND VERIFIED WITH: EARL,S AT 0450 ON 02/17/09 BY MITCHELLN RESULTS CONFIRMED BY MANUAL DILUTION   No results found for: "VD25OH"  No results found for: "PREALBUMIN"    Latest Ref Rng & Units 10/26/2022    6:03 AM 06/17/2022    9:14 AM  10/23/2009   11:27 AM  CBC EXTENDED  WBC 4.0 - 10.5 K/uL 3.7  3.6  3.9   RBC 3.87 - 5.11 MIL/uL 4.19  4.25  4.48   Hemoglobin 12.0 - 15.0 g/dL 62.1  30.8  65.7   HCT 36.0 - 46.0 % 37.6  38.9  39.0   Platelets 150 - 400 K/uL 199  239.0  238.0   NEUT# 1.4 - 7.7 K/uL  2.0  2.1   Lymph# 0.7 - 4.0 K/uL  1.3  1.4      There is no height or weight on file to calculate BMI.  Orders:  No orders of the defined types were placed in this encounter.  No orders of the defined types were placed in this encounter.    Procedures: No procedures performed  Clinical Data: No additional findings.  ROS:  All other systems negative, except as noted in the HPI. Review of Systems  Objective: Vital Signs: LMP  (LMP Unknown)   Specialty Comments:  No specialty comments available.  PMFS History: Patient Active Problem List   Diagnosis Date Noted   Hallux rigidus, right foot 10/26/2022   Housing insecurity 05/19/2022   Primary osteoarthritis involving multiple joints 04/14/2022   Bilateral carpal  tunnel syndrome 04/14/2022   Great toe pain, right 04/14/2022   ARTHRALGIA 10/23/2009   WRIST PAIN 10/23/2009   HAND PAIN 10/23/2009   HYPERTHYROIDISM 01/01/2009   GRAVES' DISEASE 11/13/2008   Past Medical History:  Diagnosis Date   Anemia    Dry eyes    pt uses eye drops   DVT (deep venous thrombosis) (HCC) 2022   DVT had drains, was on blood thinner hospitalized for 30 days, obtained a super bug that could noty be identified- was in [place].   Family history of adverse reaction to anesthesia    PGM- n/v   Graves disease    History of blood transfusion    History of head injury    x 3- loss of consciuosness for a few minutes- x 2   Hyperthyroidism    Low blood pressure    Migraines    takes 1500-2000 Advil all at one time   Osteoarthritis    can't climb stairs, "can hardly walk" - uses wheelchair   PONV (postoperative nausea and vomiting)    Thyroid storm    ~2014- on meds x 2-3  years   Wheelchair dependent     Family History  Problem Relation Age of Onset   Hyperthyroidism Mother        passed with patient was 77   Prostate cancer Father    CAD Father    Valvular heart disease Father    Skin cancer Father    Diabetes type II Sister        insulin pump; way worse after gestation diabetes   Testicular cancer Brother    Colon cancer Maternal Grandmother    Colon cancer Paternal Grandfather    Diabetes type II Paternal Grandmother     Past Surgical History:  Procedure Laterality Date   Abdominaloplasty     BREAST REDUCTION SURGERY     CHEILECTOMY Right 10/26/2022   Procedure: RIGHT GREAT TOE CHEILECTOMY;  Surgeon: Nadara Mustard, MD;  Location: [facility] OR;  Service: Orthopedics;  Laterality: Right;   COLONOSCOPY  10/2019   pelvic colon blader, colon repair      reversal of tubal ligation  2004   TUBAL LIGATION  1999   VAGINAL HYSTERECTOMY  2023   Social History   Occupational History   Not on file  Tobacco Use   Smoking status: Never   Smokeless tobacco: Never  Vaping Use   Vaping status: Never Used  Substance and Sexual Activity   Alcohol use: Not Currently   Drug use: Yes    Types: Other-see comments    Comment: CBD THC drops - last time 09/13/22-  for migraines   Sexual activity: Yes    Birth control/protection: Surgical    Comment: Hysterectomy

## 2022-11-24 ENCOUNTER — Encounter: Payer: Self-pay | Admitting: Orthopedic Surgery

## 2022-11-24 ENCOUNTER — Ambulatory Visit (INDEPENDENT_AMBULATORY_CARE_PROVIDER_SITE_OTHER): Payer: Medicaid Other | Admitting: Orthopedic Surgery

## 2022-11-24 ENCOUNTER — Other Ambulatory Visit (INDEPENDENT_AMBULATORY_CARE_PROVIDER_SITE_OTHER): Payer: Medicaid Other

## 2022-11-24 DIAGNOSIS — M25552 Pain in left hip: Secondary | ICD-10-CM
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
## 2022-11-29 DIAGNOSIS — G4733 Obstructive sleep apnea (adult) (pediatric): Secondary | ICD-10-CM | POA: Diagnosis not present

## 2022-12-09 DIAGNOSIS — R0981 Nasal congestion: Secondary | ICD-10-CM | POA: Diagnosis not present

## 2022-12-09 DIAGNOSIS — K9049 Malabsorption due to intolerance, not elsewhere classified: Secondary | ICD-10-CM | POA: Diagnosis not present

## 2022-12-09 DIAGNOSIS — Z9109 Other allergy status, other than to drugs and biological substances: Secondary | ICD-10-CM | POA: Diagnosis not present

## 2022-12-19 DIAGNOSIS — G4733 Obstructive sleep apnea (adult) (pediatric): Secondary | ICD-10-CM | POA: Diagnosis not present

## 2022-12-26 ENCOUNTER — Ambulatory Visit: Payer: Medicaid Other | Admitting: Orthopedic Surgery

## 2022-12-26 DIAGNOSIS — M2021 Hallux rigidus, right foot: Secondary | ICD-10-CM

## 2023-01-01 ENCOUNTER — Encounter: Payer: Self-pay | Admitting: Orthopedic Surgery
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
## 2023-01-02 DIAGNOSIS — G4733 Obstructive sleep apnea (adult) (pediatric): Secondary | ICD-10-CM | POA: Diagnosis not present

## 2023-01-18 DIAGNOSIS — G4733 Obstructive sleep apnea (adult) (pediatric): Secondary | ICD-10-CM | POA: Diagnosis not present

## 2023-02-18 DIAGNOSIS — G4733 Obstructive sleep apnea (adult) (pediatric): Secondary | ICD-10-CM | POA: Diagnosis not present

## 2023-02-23 DIAGNOSIS — G4733 Obstructive sleep apnea (adult) (pediatric): Secondary | ICD-10-CM | POA: Insufficient documentation

## 2023-03-03 ENCOUNTER — Other Ambulatory Visit: Payer: Self-pay | Admitting: Physician Assistant

## 2023-03-17 DIAGNOSIS — S97111A Crushing injury of right great toe, initial encounter: Secondary | ICD-10-CM | POA: Diagnosis not present

## 2023-03-17 DIAGNOSIS — M79671 Pain in right foot: Secondary | ICD-10-CM | POA: Diagnosis not present

## 2023-03-20 DIAGNOSIS — G4733 Obstructive sleep apnea (adult) (pediatric): Secondary | ICD-10-CM | POA: Diagnosis not present

## 2023-04-20 DIAGNOSIS — G4733 Obstructive sleep apnea (adult) (pediatric): Secondary | ICD-10-CM | POA: Diagnosis not present

## 2023-05-21 DIAGNOSIS — G4733 Obstructive sleep apnea (adult) (pediatric): Secondary | ICD-10-CM | POA: Diagnosis not present

## 2023-05-30 DIAGNOSIS — L578 Other skin changes due to chronic exposure to nonionizing radiation: Secondary | ICD-10-CM | POA: Diagnosis not present

## 2023-05-30 DIAGNOSIS — L820 Inflamed seborrheic keratosis: Secondary | ICD-10-CM | POA: Diagnosis not present

## 2023-05-30 DIAGNOSIS — L814 Other melanin hyperpigmentation: Secondary | ICD-10-CM | POA: Diagnosis not present

## 2023-06-04 ENCOUNTER — Other Ambulatory Visit: Payer: Self-pay | Admitting: Physician Assistant

## 2023-07-04 ENCOUNTER — Other Ambulatory Visit: Payer: Self-pay | Admitting: Physician Assistant

## 2023-07-17 DIAGNOSIS — G4733 Obstructive sleep apnea (adult) (pediatric): Secondary | ICD-10-CM | POA: Diagnosis not present

## 2023-07-19 DIAGNOSIS — G4733 Obstructive sleep apnea (adult) (pediatric): Secondary | ICD-10-CM | POA: Diagnosis not present

## 2023-08-04 ENCOUNTER — Other Ambulatory Visit: Payer: Self-pay | Admitting: Physician Assistant

## 2023-08-18 DIAGNOSIS — G4733 Obstructive sleep apnea (adult) (pediatric): Secondary | ICD-10-CM | POA: Diagnosis not present

## 2023-08-23 DIAGNOSIS — G4733 Obstructive sleep apnea (adult) (pediatric): Secondary | ICD-10-CM | POA: Diagnosis not present

## 2023-09-01 ENCOUNTER — Other Ambulatory Visit: Payer: Self-pay | Admitting: Physician Assistant

## 2023-09-15 DIAGNOSIS — D2239 Melanocytic nevi of other parts of face: Secondary | ICD-10-CM | POA: Diagnosis not present

## 2023-09-15 DIAGNOSIS — L728 Other follicular cysts of the skin and subcutaneous tissue: Secondary | ICD-10-CM | POA: Diagnosis not present

## 2023-09-18 DIAGNOSIS — G4733 Obstructive sleep apnea (adult) (pediatric): Secondary | ICD-10-CM | POA: Diagnosis not present

## 2023-10-23 DIAGNOSIS — G4733 Obstructive sleep apnea (adult) (pediatric): Secondary | ICD-10-CM | POA: Diagnosis not present

## 2023-11-28 ENCOUNTER — Encounter: Admitting: Physician Assistant

## 2023-11-28 ENCOUNTER — Encounter: Payer: Self-pay | Admitting: Physician Assistant

## 2023-11-30 ENCOUNTER — Ambulatory Visit: Payer: Self-pay

## 2023-11-30 ENCOUNTER — Telehealth: Payer: Self-pay | Admitting: Physician Assistant

## 2023-11-30 ENCOUNTER — Encounter: Payer: Self-pay | Admitting: Family Medicine

## 2023-11-30 ENCOUNTER — Ambulatory Visit (INDEPENDENT_AMBULATORY_CARE_PROVIDER_SITE_OTHER)

## 2023-11-30 ENCOUNTER — Ambulatory Visit: Admitting: Family Medicine

## 2023-11-30 ENCOUNTER — Other Ambulatory Visit: Payer: Self-pay | Admitting: Physician Assistant

## 2023-11-30 ENCOUNTER — Ambulatory Visit: Payer: Self-pay | Admitting: Family Medicine

## 2023-11-30 ENCOUNTER — Other Ambulatory Visit: Payer: Self-pay

## 2023-11-30 ENCOUNTER — Other Ambulatory Visit (HOSPITAL_COMMUNITY): Payer: Self-pay

## 2023-11-30 VITALS — BP 103/68 | HR 76 | Temp 97.9°F | Ht 66.0 in | Wt 156.0 lb

## 2023-11-30 DIAGNOSIS — R062 Wheezing: Secondary | ICD-10-CM

## 2023-11-30 DIAGNOSIS — G4733 Obstructive sleep apnea (adult) (pediatric): Secondary | ICD-10-CM | POA: Diagnosis not present

## 2023-11-30 DIAGNOSIS — N819 Female genital prolapse, unspecified: Secondary | ICD-10-CM

## 2023-11-30 MED ORDER — CELECOXIB 200 MG PO CAPS
200.0000 mg | ORAL_CAPSULE | Freq: Every day | ORAL | 1 refills | Status: DC
  Filled 2023-11-30: qty 90, 90d supply, fill #0

## 2023-11-30 MED ORDER — ALBUTEROL SULFATE HFA 108 (90 BASE) MCG/ACT IN AERS
2.0000 | INHALATION_SPRAY | Freq: Four times a day (QID) | RESPIRATORY_TRACT | 0 refills | Status: DC | PRN
  Filled 2023-11-30: qty 18, 30d supply, fill #0

## 2023-11-30 NOTE — Progress Notes (Signed)
 Good news.  Her x-ray is clear.  She should let us  know if her symptoms do not improve with the albuterol  inhaler we sent in today or if she would like to see a pulmonologist.
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
## 2023-11-30 NOTE — Patient Instructions (Signed)
 It was very nice to see you today!  VISIT SUMMARY: You visited us  today due to wheezing and shortness of breath during exercise, particularly swimming. We discussed potential causes and started a treatment plan to help manage your symptoms. We also addressed your CPAP-related discomfort and your desire to increase physical activity safely.  YOUR PLAN: EXERCISE-INDUCED WHEEZING AND DYSPNEA: You have been experiencing wheezing and shortness of breath during strenuous activities, which may be due to exercise-induced asthma or other conditions. -We prescribed an inhaler for you to use before exercise or activities that trigger your symptoms. -Avoid strenuous swimming until your symptoms are better controlled. -We ordered a chest x-ray to rule out other lung issues. -If your symptoms do not improve with the inhaler, we may refer you to a lung specialist. -Follow up with us  in 1-2 weeks to assess your response to the inhaler and review your x-ray results.  OBSTRUCTIVE SLEEP APNEA ON CPAP: You are experiencing chest discomfort with your CPAP machine, possibly due to high pressure settings. -Contact your CPAP provider to discuss and adjust the pressure settings.  HISTORY OF PELVIC ORGAN PROLAPSE WITH SURGICAL REPAIR: You want to increase your physical activity and weight lifting for better health. -It is ok to gradually increase the weight while monitoring for any symptoms. -Consider seeing a gynecologist if you need further guidance on increasing physical activity.  Return if symptoms worsen or fail to improve.   Take care, Dr Kennyth  PLEASE NOTE:  If you had any lab tests, please let us  know if you have not heard back within a few days. You may see your results on mychart before we have a chance to review them but we will give you a call once they are reviewed by us .   If we ordered any referrals today, please let us  know if you have not heard from their office within the next week.   If you  had any urgent prescriptions sent in today, please check with the pharmacy within an hour of our visit to make sure the prescription was transmitted appropriately.   Please try these tips to maintain a healthy lifestyle:  Eat at least 3 REAL meals and 1-2 snacks per day.  Aim for no more than 5 hours between eating.  If you eat breakfast, please do so within one hour of getting up.   Each meal should contain half fruits/vegetables, one quarter protein, and one quarter carbs (no bigger than a computer mouse)  Cut down on sweet beverages. This includes juice, soda, and sweet tea.   Drink at least 1 glass of water with each meal and aim for at least 8 glasses per day  Exercise at least 150 minutes every week.

## 2023-11-30 NOTE — Telephone Encounter (Signed)
 Medication was sent in 11/30/2023

## 2023-11-30 NOTE — Telephone Encounter (Signed)
 noted

## 2023-11-30 NOTE — Telephone Encounter (Signed)
 Pt is scheduled for CPE on 02/05/24   Prescription Request  11/30/2023  LOV: 06/17/22  What is the name of the medication or equipment? celecoxib  (CELEBREX ) 100 MG capsule   Have you contacted your pharmacy to request a refill? Yes   Which pharmacy would you like this sent to?  [facility] [address] [place] [address] Phone: [phone] Fax: [phone]    Patient notified that their request is being sent to the clinical staff for review and that they should receive a response within 2 business days.   Please advise at Mobile [phone] (mobile)

## 2023-12-01 ENCOUNTER — Other Ambulatory Visit: Payer: Self-pay

## 2023-12-01 ENCOUNTER — Other Ambulatory Visit (HOSPITAL_COMMUNITY): Payer: Self-pay

## 2023-12-01 MED ORDER — CELECOXIB 200 MG PO CAPS: 200.0000 mg | ORAL_CAPSULE | Freq: Every day | ORAL | 1 refills | Status: DC

## 2023-12-02 ENCOUNTER — Other Ambulatory Visit: Payer: Self-pay | Admitting: Medical Genetics

## 2023-12-08 ENCOUNTER — Ambulatory Visit: Payer: Self-pay

## 2023-12-08 ENCOUNTER — Other Ambulatory Visit: Payer: Self-pay

## 2023-12-08 ENCOUNTER — Encounter: Payer: Self-pay | Admitting: Internal Medicine

## 2023-12-08 ENCOUNTER — Ambulatory Visit: Admitting: Internal Medicine

## 2023-12-08 VITALS — BP 96/64 | HR 87 | Temp 98.2°F | Ht 66.0 in | Wt 156.2 lb

## 2023-12-08 DIAGNOSIS — M25511 Pain in right shoulder: Secondary | ICD-10-CM

## 2023-12-08 DIAGNOSIS — Z86718 Personal history of other venous thrombosis and embolism: Secondary | ICD-10-CM | POA: Diagnosis not present

## 2023-12-08 MED ORDER — RIVAROXABAN 10 MG PO TABS: 10.0000 mg | ORAL_TABLET | Freq: Every day | ORAL | Status: DC

## 2023-12-08 NOTE — Progress Notes (Signed)
 ==============================  [facility]: [phone]   -- Medical Office Visit --  Patient: Sherry Decker      Age: 63 y.o.       Sex:  female  Date:   12/08/2023 Today's Healthcare Provider: Bernardino KANDICE Cone, MD  ==============================   Chief Complaint: Right Shoulder pain (States swimming this AM and started hurting bad she has iced it all day not helping. When moving it hurts. Would like to know if she could go to PT to help with her should pain. )   Discussed the use of AI scribe software for clinical note transcription with the patient, who gave verbal consent to proceed.  History of Present Illness  63 year old female with arthritis who presents with right shoulder pain after swimming.  She experiences significant right shoulder pain that began this morning while swimming. The pain is described as 'hot, like electric, hot, feeling hot' and 'heavy dull pain' over the entire shoulder area, with tenderness upon palpation. She reports significant right shoulder pain with associated difficulty closing her right hand, which is also slightly swollen and harder to close compared to the left hand; she feels pain in her shoulder when moving her hand.  She has a history of arthritis and has been using swimming as a form of exercise to manage her condition. She mentions having sore shoulders from swimming previously, but the current pain is more severe and has not been relieved by ice and ibuprofen, which she has been using since the onset of pain at 6:30 AM today.  She recalls a past incident several years ago where she experienced pain in the same shoulder while pushing clothes at a thrift store, leading her to avoid using her right hand for similar activities. She denies any recent trauma to the shoulder but notes increased physical activity through swimming.  Her past medical history includes a toe replacement and arthritic hips, which limit  her ability to engage in weight-bearing exercises, making swimming her primary form of exercise. She also has a history of a deep vein thrombosis (DVT) three years ago, for which she was treated with blood thinners for six to nine months.  She reports difficulty carrying groceries and an inability to lie on her right side due to the shoulder pain. She is concerned about maintaining her exercise routine and managing her weight, as swimming is her main form of exercise due to limitations from arthritis and previous foot surgery. History of mysterious groin deep vein thrombosis 3 year(s) off anticoagulation now. Background Reviewed: Problem List: has Toxic diffuse goiter; Hyperthyroidism; Pain in joint; WRIST PAIN; HAND PAIN; Primary osteoarthritis involving multiple joints; Bilateral carpal tunnel syndrome; Great toe pain, right; Housing insecurity; Hallux rigidus, right foot; OSA (obstructive sleep apnea); Graves' disease; Acute pain of right shoulder; and History of DVT (deep vein thrombosis) on their problem list. Past Medical History:  has a past medical history of Anemia, Dry eyes, DVT (deep venous thrombosis) (HCC) (2022), Family history of adverse reaction to anesthesia, Graves disease, History of blood transfusion, History of head injury, Hyperthyroidism, Low blood pressure, Migraines, OSA (obstructive sleep apnea) (02/23/2023), Osteoarthritis, PONV (postoperative nausea and vomiting), Thyroid storm, and Wheelchair dependent. Past Surgical History:   has a past surgical history that includes pelvic colon blader, colon repair ; Breast reduction surgery; Tubal ligation (1999); reversal of tubal ligation (2004); Abdominaloplasty; Colonoscopy (10/2019); Vaginal hysterectomy (2023); and Cheilectomy (Right, 10/26/2022). Social History:   reports that she  has never smoked. She has never used smokeless tobacco. She reports that she does not currently use alcohol. She reports current drug use. Drug: Other-see  comments. Family History:  family history includes CAD in her father; Colon cancer in her maternal grandmother and paternal grandfather; Diabetes type II in her paternal grandmother and sister; Hyperthyroidism in her mother; Prostate cancer in her father; Skin cancer in her father; Testicular cancer in her brother; Valvular heart disease in her father. Allergies:  is allergic to codeine and penicillins.   Medication Reconciliation: Current Outpatient Medications on File Prior to Visit  Medication Sig   acetaminophen  (TYLENOL ) 500 MG tablet Take 1,500 mg by mouth every 6 (six) hours as needed (severe migraine headaches.).   albuterol  (VENTOLIN  HFA) 108 (90 Base) MCG/ACT inhaler Inhale 2 puffs into the lungs every 6 (six) hours as needed for wheezing or shortness of breath.   celecoxib  (CELEBREX ) 200 MG capsule Take 1 capsule (200 mg total) by mouth daily.   estradiol (ESTRACE) 0.1 MG/GM vaginal cream Place 1 Applicatorful vaginally.   OVER THE COUNTER MEDICATION Take 1 drop by mouth daily as needed (severe migraine pain - patient obtained this in [place] where [facility] is legal). 1 drop has CBD oil 3.5 mg 1 drop has 3.5 mg of THC  Patient takes as last resort for migraines   RETIN-A 0.05 % cream Apply topically at bedtime.   No current facility-administered medications on file prior to visit.  There are no discontinued medications.   Physical Exam:    12/08/2023    1:58 PM 11/30/2023    1:25 PM 10/26/2022    8:36 AM  Vitals with BMI  Height 5' 6 5' 6   Weight 156 lbs 3 oz 156 lbs   BMI 25.22 25.19   Systolic 96 103   Diastolic 64 68   Pulse 87 76 56  Vital signs reviewed.  Nursing notes reviewed. Weight trend reviewed. Physical Activity: Not on file   General Appearance:  No acute distress appreciable.   Well-groomed, healthy-appearing female.  Well proportioned with no abnormal fat distribution.  Good muscle tone. Pulmonary:  Normal work of breathing at rest, no respiratory distress  apparent. SpO2: 98 %  Musculoskeletal: All extremities are intact.  Neurological:  Awake, alert, oriented, and engaged.  No obvious focal neurological deficits or cognitive impairments.  Sensorium seems unclouded.   Speech is clear and coherent with logical content. Psychiatric:  Appropriate mood, pleasant and cooperative demeanor, thoughtful and engaged during the exam  Tenderness everywhere over deltoid but in specific 2-5 mm location points, tenderness with all movement, even passive, about 60% passive range of motion and very limited active range of motion, no apparent swelling or cords     11/30/2023    1:24 PM 06/17/2022    8:35 AM 05/19/2022   10:04 AM  PHQ 2/9 Scores  PHQ - 2 Score 0 0 0  PHQ- 9 Score  2 0   Admission on 10/26/2022, Discharged on 10/26/2022  Component Date Value Ref Range Status   WBC 10/26/2022 3.7 (L)  4.0 - 10.5 K/uL Final   RBC 10/26/2022 4.19  3.87 - 5.11 MIL/uL Final   Hemoglobin 10/26/2022 12.6  12.0 - 15.0 g/dL Final   HCT 92/75/7975 37.6  36.0 - 46.0 % Final   MCV 10/26/2022 89.7  80.0 - 100.0 fL Final   MCH 10/26/2022 30.1  26.0 - 34.0 pg Final   MCHC 10/26/2022 33.5  30.0 - 36.0 g/dL Final  RDW 10/26/2022 13.2  11.5 - 15.5 % Final   Platelets 10/26/2022 199  150 - 400 K/uL Final   nRBC 10/26/2022 0.0  0.0 - 0.2 % Final  Office Visit on 06/17/2022  Component Date Value Ref Range Status   HM Colonoscopy 10/29/2019 See Report (in chart)  See Report (in chart), Patient Reported Final   High risk HPV 06/17/2022 Negative   Final   Adequacy 06/17/2022 Satisfactory for evaluation.   Final   Diagnosis 06/17/2022 - Negative for intraepithelial lesion or malignancy (NILM)   Final   Comment 06/17/2022 Normal Reference Range HPV - Negative   Final   WBC 06/17/2022 3.6 (L)  4.0 - 10.5 K/uL Final   RBC 06/17/2022 4.25  3.87 - 5.11 Mil/uL Final   Hemoglobin 06/17/2022 13.1  12.0 - 15.0 g/dL Final   HCT 96/84/7975 38.9  36.0 - 46.0 % Final   MCV 06/17/2022 91.5   78.0 - 100.0 fl Final   MCHC 06/17/2022 33.7  30.0 - 36.0 g/dL Final   RDW 96/84/7975 13.5  11.5 - 15.5 % Final   Platelets 06/17/2022 239.0  150.0 - 400.0 K/uL Final   Neutrophils Relative % 06/17/2022 54.3  43.0 - 77.0 % Final   Lymphocytes Relative 06/17/2022 34.5  12.0 - 46.0 % Final   Monocytes Relative 06/17/2022 8.1  3.0 - 12.0 % Final   Eosinophils Relative 06/17/2022 2.2  0.0 - 5.0 % Final   Basophils Relative 06/17/2022 0.9  0.0 - 3.0 % Final   Neutro Abs 06/17/2022 2.0  1.4 - 7.7 K/uL Final   Lymphs Abs 06/17/2022 1.3  0.7 - 4.0 K/uL Final   Monocytes Absolute 06/17/2022 0.3  0.1 - 1.0 K/uL Final   Eosinophils Absolute 06/17/2022 0.1  0.0 - 0.7 K/uL Final   Basophils Absolute 06/17/2022 0.0  0.0 - 0.1 K/uL Final   Sodium 06/17/2022 141  135 - 145 mEq/L Final   Potassium 06/17/2022 4.3  3.5 - 5.1 mEq/L Final   Chloride 06/17/2022 104  96 - 112 mEq/L Final   CO2 06/17/2022 29  19 - 32 mEq/L Final   Glucose, Bld 06/17/2022 107 (H)  70 - 99 mg/dL Final   BUN 96/84/7975 19  6 - 23 mg/dL Final   Creatinine, Ser 06/17/2022 0.84  0.40 - 1.20 mg/dL Final   Total Bilirubin 06/17/2022 0.6  0.2 - 1.2 mg/dL Final   Alkaline Phosphatase 06/17/2022 52  39 - 117 U/L Final   AST 06/17/2022 14  0 - 37 U/L Final   ALT 06/17/2022 8  0 - 35 U/L Final   Total Protein 06/17/2022 7.0  6.0 - 8.3 g/dL Final   Albumin 96/84/7975 4.5  3.5 - 5.2 g/dL Final   GFR 96/84/7975 74.66  >60.00 mL/min Final   Calcium 06/17/2022 9.5  8.4 - 10.5 mg/dL Final   Cholesterol 96/84/7975 209 (H)  0 - 200 mg/dL Final   Triglycerides 96/84/7975 148.0  0.0 - 149.0 mg/dL Final   HDL 96/84/7975 60.70  >39.00 mg/dL Final   VLDL 96/84/7975 29.6  0.0 - 40.0 mg/dL Final   LDL Cholesterol 06/17/2022 119 (H)  0 - 99 mg/dL Final   Total CHOL/HDL Ratio 06/17/2022 3   Final   NonHDL 06/17/2022 148.45   Final   TSH 06/17/2022 1.58  0.35 - 5.50 uIU/mL Final  Admission on 04/20/2022, Discharged on 04/21/2022  Component Date  Value Ref Range Status   SARS Coronavirus 2 by RT PCR 04/20/2022 NEGATIVE  NEGATIVE  Final   Influenza A by PCR 04/20/2022 NEGATIVE  NEGATIVE Final   Influenza B by PCR 04/20/2022 NEGATIVE  NEGATIVE Final   Resp Syncytial Virus by PCR 04/20/2022 NEGATIVE  NEGATIVE Final  No image results found. DG Chest 2 View Result Date: 11/30/2023 CLINICAL DATA:  wheezing. EXAM: DG CHEST 2V COMPARISON:  None Available. FINDINGS: Bilateral lung fields are clear. Bilateral costophrenic angles are clear. Normal cardio-mediastinal silhouette. No acute osseous abnormalities. The soft tissues are within normal limits. IMPRESSION: No active cardiopulmonary disease. Electronically Signed   By: Ree Molt M.D.   On: 11/30/2023 14:03         ASSESSMENT & PLAN   Assessment & Plan Acute pain of right shoulder Right shoulder pain with suspected partial rotator cuff tear   Right shoulder pain is linked to increased swimming activity, presenting as hot, heavy, and dull with tenderness on palpation. No trauma history is noted. Differential diagnosis includes subacromial bursitis and rotator cuff tendinitis, but a partial rotator cuff tear is suspected due to previous injury and current symptoms. Pain worsens with movement and certain activities, with no evidence of complete tendon rupture. Refer to sports medicine for further evaluation and management. Advise rest, ice, and compression for the shoulder. Avoid activities that exacerbate pain. Consider physical therapy for rehabilitation.  Osteoarthritis   Osteoarthritis is present with previous joint replacement and current arthritic changes in the hips. Swimming is the primary form of exercise due to arthritis limitations. Encourage continuation of swimming as tolerated, avoiding exacerbation of shoulder pain. History of DVT (deep vein thrombosis) Rule out right upper extremity deep vein thrombosis (DVT)   Right upper extremity DVT is a concern due to a history of DVT  and current symptoms, including swelling and difficulty closing the right hand. Although rotator cuff pathology is considered, DVT is prioritized due to previous history and atypical shoulder pain presentation. Order a stat right upper extremity duplex ultrasound to rule out DVT. Provide sample blood thinners (Xarelto ) to be taken until DVT is ruled out.  History of deep vein thrombosis (DVT)   DVT occurred in the groin area three years ago, treated with anticoagulation for six to nine months. She is not currently on anticoagulation therapy. The previous DVT was idiopathic with no identified hypercoagulable state.   ORDER ASSOCIATIONS  #   DIAGNOSIS / CONDITION ICD-10 ENCOUNTER ORDER     ICD-10-CM   1. Acute pain of right shoulder  M25.511 [facility] UPPER EXTREMITY VENOUS DUPLEX UNILAT (BACK OFFICE)    Ambulatory referral to Sports Medicine    VAS US  UPPER EXTREMITY VENOUS DUPLEX    CANCELED: Ambulatory referral to Sports Medicine    2. History of DVT (deep vein thrombosis)  Z86.718 Ambulatory referral to Sports Medicine    US  Venous Img Upper Uni Right(DVT)    VAS US  UPPER EXTREMITY VENOUS DUPLEX    CANCELED: Ambulatory referral to Sports Medicine         Orders Placed in Encounter:   Meds ordered this encounter  Medications   rivaroxaban  (XARELTO ) 10 MG TABS tablet    Sig: Take 1 tablet (10 mg total) by mouth daily.    Orders Placed This Encounter  Procedures   US  Venous Img Upper Uni Right(DVT)    Gave Xarelto  20 mg daily to start prior to procedure order    Standing Status:   Future    Expiration Date:   12/07/2024    Reason for Exam (SYMPTOM  OR DIAGNOSIS REQUIRED):  shoulder pain histor of groin dvt    Preferred imaging location?:   Internal   Ambulatory referral to Sports Medicine    Referral Priority:   Urgent    Referral Type:   Consultation    Number of Visits Requested:   1   Medical Decision Making: 1 or more chronic illnesses with exacerbation,  progression, or side  effects of treatment 1 acute complicated injury Prescription drug management  Decision regarding minor procedure with identified patient or procedure risk factors       This document was synthesized by artificial intelligence ([employer]bridge) using HIPAA-compliant recording of the clinical interaction;   We discussed the use of AI scribe software for clinical note transcription with the patient, who gave verbal consent to proceed. additional Info: This encounter employed state-of-the-art, real-time, collaborative documentation. The patient actively reviewed and assisted in updating their electronic medical record on a shared screen, ensuring transparency and facilitating joint problem-solving for the problem list, overview, and plan. This approach promotes accurate, informed care. The treatment plan was discussed and reviewed in detail, including medication safety, potential side effects, and all patient questions. We confirmed understanding and comfort with the plan. Follow-up instructions were established, including contacting the office for any concerns, returning if symptoms worsen, persist, or new symptoms develop, and precautions for potential emergency department visits.

## 2023-12-08 NOTE — Telephone Encounter (Signed)
 Appt today

## 2023-12-08 NOTE — Telephone Encounter (Signed)
 FYI Only or Action Required?: FYI only for provider.  Patient was last seen in primary care on 11/30/2023 by Kennyth Worth HERO, MD.  Called Nurse Triage reporting Shoulder Injury.  Symptoms began today.  Interventions attempted: Ice/heat application.  Symptoms are: right shoulder pain after swimming injury this morningimproved after 20 minute ice application.  Triage Disposition: See Physician Within 24 Hours  Patient/caregiver understands and will follow disposition?: Yes         Copied from CRM [id number]. Topic: Clinical - Red Word Triage >> Dec 08, 2023  9:23 AM Roselie BROCKS wrote: [facility] that prompted transfer to Nurse Triage: Patient hurt shoulder swimming, has pain even  after iceing it Reason for Disposition  [1] MODERATE pain (e.g., interferes with normal activities) AND [2] high-risk adult (e.g., age > 60 years, osteoporosis, chronic steroid use)  Answer Assessment - Initial Assessment Questions Patient states she would like to see a provider and discuss physical therapy referral and possibly an xray as she thinks this may be related to her arthritis. Patient states he takes Celebrex  and is asking if she can take Tylenol  or Advil with it. Advised patient to not take NSAIDs if on Celebrex .  1. MECHANISM: How did the injury happen?     She states she has been swimming more for exercise. She states she thinks she may have had bad technique or was swimming too fast and started to experience  pain in her right shoulder.  2. ONSET: When did the injury happen? (e.g., minutes, hours ago)      This morning she was swimming 0530-0700.  3. APPEARANCE of INJURY: What does the injury look like?      Appears normal.  4. SEVERITY: Can you move the shoulder normally?      Yes. She states she can fully move the shoulder but certain movements or positions are painful.  5. SIZE: For cuts, bruises, or swelling, ask: How large is it? (e.g., inches or centimeters;  entire joint)       No.  6. PAIN: Is there pain? If Yes, ask: How bad is the pain?  (Scale 0-10; or none, mild, moderate, severe)     Yes, right shoulder. 5/10, ice applied for about 20 minutes and states now it is 3/10.  7. TETANUS: For any breaks in the skin, ask: When was your last tetanus booster?     N/A.  8. OTHER SYMPTOMS: Do you have any other symptoms? (e.g., loss of sensation)     Denies weakness, numbness or tingling in right arm. She states when she opens and closes her right hand it feels swollen and slow. She states she has arthritis and she is unsure if it is related to that. She states there is some swelling in the right hand, states hardly noticeable or bad.  9. PREGNANCY: Is there any chance you are pregnant? When was your last menstrual period?     N/A.  Protocols used: Shoulder Injury-A-AH

## 2023-12-09 NOTE — Patient Instructions (Signed)
 It was a pleasure seeing you today! Your health and satisfaction are our top priorities.  Bernardino Cone, MD  VISIT SUMMARY: Today, you were seen for significant right shoulder pain that began this morning while swimming. You described the pain as hot, heavy, and dull, with tenderness and difficulty closing your right hand. You have a history of arthritis and have been using swimming as your primary form of exercise. The pain has not been relieved by ice and ibuprofen.  YOUR PLAN: -RIGHT SHOULDER PAIN WITH SUSPECTED PARTIAL ROTATOR CUFF TEAR: A partial rotator cuff tear means that some of the tendons in your shoulder are damaged but not completely torn. You should rest your shoulder, apply ice, and use compression to reduce pain and swelling. Avoid activities that make the pain worse. You will be referred to sports medicine for further evaluation and management, and physical therapy may be considered for rehabilitation.  -RULE OUT RIGHT UPPER EXTREMITY DEEP VEIN THROMBOSIS (DVT): Deep vein thrombosis (DVT) is a blood clot that forms in a deep vein, which can be serious. Given your history of DVT and current symptoms, we need to rule out this condition. A stat right upper extremity duplex ultrasound has been ordered to check for DVT. In the meantime, you will take sample blood thinners (Xarelto ) until we confirm whether or not you have DVT.  -HISTORY OF DEEP VEIN THROMBOSIS (DVT): You had a DVT in your groin area three years ago, which was treated with blood thinners for six to nine months. You are not currently on blood thinners, and there was no identified cause for the previous DVT.  -OSTEOARTHRITIS: Osteoarthritis is a condition where the protective cartilage that cushions the ends of your bones wears down over time. You have arthritis in your hips and have had a joint replacement. Swimming is encouraged as your primary form of exercise, but you should avoid activities that worsen your shoulder  pain.  INSTRUCTIONS: You will be referred to sports medicine for further evaluation of your shoulder pain. A stat right upper extremity duplex ultrasound has been ordered to rule out DVT. In the meantime, take the sample blood thinners (Xarelto ) provided. Rest your shoulder, apply ice, and use compression to reduce pain and swelling. Avoid activities that make the pain worse. Consider physical therapy for rehabilitation after further evaluation.  Your Providers PCP: Allwardt, Alyssa M, PA-C,  [phone]) Referring Provider: Allwardt, Alyssa M, PA-C,  [phone])  NEXT STEPS: [x]  Early Intervention: Schedule sooner appointment, call our on-call services, or go to emergency room if there is any significant Increase in pain or discomfort New or worsening symptoms Sudden or severe changes in your health [x]  Flexible Follow-Up: We recommend a No follow-ups on file. for optimal routine care. This allows for progress monitoring and treatment adjustments. [x]  Preventive Care: Schedule your annual preventive care visit! It's typically covered by insurance and helps identify potential health issues early. [x]  Lab & X-ray Appointments: Incomplete tests scheduled today, or call to schedule. X-rays: [facility] (M-F, 8:30am-noon or 1pm-5pm). [x]  Medical Information Release: Sign a release form at front desk to obtain relevant medical information we don't have.  MAKING THE MOST OF OUR FOCUSED 20 MINUTE APPOINTMENTS: [x]   Clearly state your top concerns at the beginning of the visit to focus our discussion [x]   If you anticipate you will need more time, please inform the front desk during scheduling - we can book multiple appointments in the same week. [x]   If you have transportation  problems- use our convenient video appointments or ask about transportation support. [x]   We can get down to business faster if you use MyChart to update information before the visit and submit non-urgent  questions before your visit. Thank you for taking the time to provide details through MyChart.  Let our nurse know and she can import this information into your encounter documents.  Arrival and Wait Times: [x]   Arriving on time ensures that everyone receives prompt attention. [x]   Early morning (8a) and afternoon (1p) appointments tend to have shortest wait times. [x]   Unfortunately, we cannot delay appointments for late arrivals or hold slots during phone calls.  Getting Answers and Following Up [x]   Simple Questions & Concerns: For quick questions or basic follow-up after your visit, reach us  at [phone] or MyChart messaging. [x]   Complex Concerns: If your concern is more complex, scheduling an appointment might be best. Discuss this with the staff to find the most suitable option. [x]   Lab & Imaging Results: We'll contact you directly if results are abnormal or you don't use MyChart. Most normal results will be on MyChart within 2-3 business days, with a review message from Dr. Jesus. Haven't heard back in 2 weeks? Need results sooner? Contact us  at [phone]. [x]   Referrals: Our referral coordinator will manage specialist referrals. The specialist's office should contact you within 2 weeks to schedule an appointment. Call us  if you haven't heard from them after 2 weeks.  Staying Connected [x]   MyChart: Activate your MyChart for the fastest way to access results and message us . See the last page of this paperwork for instructions on how to activate.  Bring to Your Next Appointment [x]   Medications: Please bring all your medication bottles to your next appointment to ensure we have an accurate record of your prescriptions. [x]   Health Diaries: If you're monitoring any health conditions at home, keeping a diary of your readings can be very helpful for discussions at your next appointment.  Billing [x]   X-ray & Lab Orders: These are billed by separate companies. Contact the  invoicing company directly for questions or concerns. [x]   Visit Charges: Discuss any billing inquiries with our administrative services team.  Your Satisfaction Matters [x]   Share Your Experience: We strive for your satisfaction! If you have any complaints, or preferably compliments, please let Dr. Jesus know directly or contact our Practice Administrators, Manuelita Rubin or [employer], by asking at the front desk.   Reviewing Your Records [x]   Review this early draft of your clinical encounter notes below and the final encounter summary tomorrow on MyChart after its been completed.  All orders placed so far are visible here: Acute pain of right shoulder -     UPPER EXTREMITY VENOUS DUPLEX UNILAT (BACK OFFICE); Future -     Ambulatory referral to Sports Medicine -     VAS US  UPPER EXTREMITY VENOUS DUPLEX; Future  History of DVT (deep vein thrombosis) -     Ambulatory referral to Sports Medicine -     US  Venous Img Upper Uni Right(DVT); Future -     VAS US  UPPER EXTREMITY VENOUS DUPLEX; Future  Other orders -     Rivaroxaban ; Take 1 tablet (10 mg total) by mouth daily.

## 2023-12-09 NOTE — Assessment & Plan Note (Signed)
 Rule out right upper extremity deep vein thrombosis (DVT)   Right upper extremity DVT is a concern due to a history of DVT and current symptoms, including swelling and difficulty closing the right hand. Although rotator cuff pathology is considered, DVT is prioritized due to previous history and atypical shoulder pain presentation. Order a stat right upper extremity duplex ultrasound to rule out DVT. Provide sample blood thinners (Xarelto ) to be taken until DVT is ruled out.  History of deep vein thrombosis (DVT)   DVT occurred in the groin area three years ago, treated with anticoagulation for six to nine months. She is not currently on anticoagulation therapy. The previous DVT was idiopathic with no identified hypercoagulable state.

## 2023-12-09 NOTE — Assessment & Plan Note (Signed)
 Right shoulder pain with suspected partial rotator cuff tear   Right shoulder pain is linked to increased swimming activity, presenting as hot, heavy, and dull with tenderness on palpation. No trauma history is noted. Differential diagnosis includes subacromial bursitis and rotator cuff tendinitis, but a partial rotator cuff tear is suspected due to previous injury and current symptoms. Pain worsens with movement and certain activities, with no evidence of complete tendon rupture. Refer to sports medicine for further evaluation and management. Advise rest, ice, and compression for the shoulder. Avoid activities that exacerbate pain. Consider physical therapy for rehabilitation.  Osteoarthritis   Osteoarthritis is present with previous joint replacement and current arthritic changes in the hips. Swimming is the primary form of exercise due to arthritis limitations. Encourage continuation of swimming as tolerated, avoiding exacerbation of shoulder pain.

## 2023-12-11 ENCOUNTER — Ambulatory Visit (HOSPITAL_COMMUNITY)
Admission: RE | Admit: 2023-12-11 | Discharge: 2023-12-11 | Disposition: A | Discharge status: Home / Self Care | Source: Ambulatory Visit | Attending: Internal Medicine | Admitting: Internal Medicine

## 2023-12-11 DIAGNOSIS — M25511 Pain in right shoulder: Secondary | ICD-10-CM | POA: Diagnosis not present

## 2023-12-11 DIAGNOSIS — Z86718 Personal history of other venous thrombosis and embolism: Secondary | ICD-10-CM | POA: Insufficient documentation

## 2023-12-14 NOTE — Progress Notes (Unsigned)
 [facility] [address] [place] [address] Phone: [phone]   Assessment and Plan:     1. Chronic right shoulder pain (Primary) 2. 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  Tolerated well per note below - May continue to use ibuprofen or Celebrex  as needed for pain relief - Start HEP for shoulder and rotator cuff - May start physical therapy at [facility] well with aquatic therapy  Procedure: Subacromial Injection Side: Right  Risks explained and consent was given verbally. The site was cleaned with alcohol prep. A steroid injection was performed from posterior approach using 2mL of 1% lidocaine without epinephrine and 1mL of kenalog 40mg /ml. This was well tolerated.  Needle was removed, hemostasis achieved, and post injection instructions were explained.   Pt was advised to call or return to clinic if these symptoms worsen or fail to improve as anticipated.    15 additional minutes spent for educating Therapeutic Home Exercise Program.  This included exercises focusing on stretching, strengthening, with focus on eccentric aspects.   Long term goals include an improvement in range of motion, strength, endurance as well as avoiding reinjury. Patient's frequency would include in 1-2 times a day, 3-5 times a week for a duration of 6-12 weeks. Proper technique shown and discussed handout in great detail with ATC.  All questions were discussed and answered.    Pertinent previous records reviewed include vascular ultrasound 12/11/2023   Follow Up: 4 to 6 weeks for reevaluation.  Could consider NSAID course versus ultrasound versus advanced imaging    Subjective:   I, Sherry Decker, am serving as a Neurosurgeon for Doctor Morene Mace  Chief Complaint: right shoulder pain   HPI:  12/15/2023 Patient is a 63 year old female with right shoulder pain. Patient states right shoulder pain for 3-4 weeks. States she has been swimming and that has helped with arthritis but she has over done it recently. Decreased ROM. Celebrex  and ibu for the pain . Ice helps a little. No numbness or tingling    Relevant Historical Information: Hypothyroidism  Additional pertinent review of systems negative.   Current Outpatient Medications:    acetaminophen  (TYLENOL ) 500 MG tablet, Take 1,500 mg by mouth every 6 (six) hours as needed (severe migraine headaches.)., Disp: , Rfl:    albuterol  (VENTOLIN  HFA) 108 (90 Base) MCG/ACT inhaler, Inhale 2 puffs into the lungs every 6 (six) hours as needed for wheezing or shortness of breath., Disp: 18 g, Rfl: 0   celecoxib  (CELEBREX ) 200 MG capsule, Take 1 capsule (200 mg total) by mouth daily., Disp: 90 capsule, Rfl: 1   estradiol (ESTRACE) 0.1 MG/GM vaginal cream, Place 1 Applicatorful vaginally., Disp: , Rfl:    OVER THE COUNTER MEDICATION, Take 1 drop by mouth daily as needed (severe migraine pain - patient obtained this in [place] where [facility] is legal). 1 drop has CBD oil 3.5 mg 1 drop has 3.5 mg of THC  Patient takes as last resort for migraines, Disp: , Rfl:    RETIN-A 0.05 % cream, Apply topically at bedtime., Disp: , Rfl:    rivaroxaban  (XARELTO ) 10 MG TABS tablet, Take 1 tablet (10 mg total) by mouth daily., Disp: , Rfl:    Objective:  Vitals:   12/15/23 1441  Pulse: 73  SpO2: 99%  Weight: 159 lb (72.1 kg)  Height: 5' 6 (1.676 m)      Body mass index is 25.66 kg/m.    Physical Exam:    Gen: Appears well, nad, nontoxic and pleasant Neuro:sensation intact, strength is 5/5 with df/pf/inv/ev, muscle tone wnl Skin: no suspicious lesion or defmority Psych: A&O, appropriate mood and affect  Right  shoulder:  No deformity, swelling or muscle wasting No scapular winging FF 180 with painful arc, abd 180 with painful arc, int 10 with painful arc, ext 90 NTTP over the [place], clavicle, ac, coracoid, biceps groove, humerus, deltoid, trapezius, cervical spine Positive neer, hawkins, empty can, obriens, crossarm, Negative subscap liftoff, speeds Neg ant drawer, sulcus sign, apprehension Negative Spurling's test bilat FROM of neck    Electronically signed by:  Odis Mace D.[facility] 3:07 PM 12/15/23

## 2023-12-15 ENCOUNTER — Ambulatory Visit: Payer: Self-pay | Admitting: Internal Medicine

## 2023-12-15 ENCOUNTER — Ambulatory Visit: Admitting: Sports Medicine

## 2023-12-15 VITALS — HR 73 | Ht 66.0 in | Wt 159.0 lb

## 2023-12-15 DIAGNOSIS — M7551 Bursitis of right shoulder: Secondary | ICD-10-CM | POA: Diagnosis not present

## 2023-12-15 DIAGNOSIS — M25511 Pain in right shoulder: Secondary | ICD-10-CM | POA: Diagnosis not present

## 2023-12-15 DIAGNOSIS — G8929 Other chronic pain: Secondary | ICD-10-CM | POA: Diagnosis not present

## 2023-12-15 NOTE — Patient Instructions (Signed)
 Shoulder HEP   4 week follow up

## 2024-01-12 ENCOUNTER — Ambulatory Visit: Admitting: Sports Medicine

## 2024-01-15 ENCOUNTER — Other Ambulatory Visit: Payer: Self-pay | Admitting: Physician Assistant

## 2024-01-15 ENCOUNTER — Ambulatory Visit: Admitting: Sports Medicine

## 2024-01-15 ENCOUNTER — Other Ambulatory Visit: Payer: Self-pay

## 2024-01-15 VITALS — BP 120/80 | HR 80 | Ht 66.0 in | Wt 157.0 lb

## 2024-01-15 DIAGNOSIS — M25511 Pain in right shoulder: Secondary | ICD-10-CM

## 2024-01-15 DIAGNOSIS — Z1231 Encounter for screening mammogram for malignant neoplasm of breast: Secondary | ICD-10-CM

## 2024-01-15 DIAGNOSIS — M7551 Bursitis of right shoulder: Secondary | ICD-10-CM

## 2024-01-15 DIAGNOSIS — G8929 Other chronic pain: Secondary | ICD-10-CM

## 2024-01-15 NOTE — Progress Notes (Signed)
 Ben Tarus Briski D[facility] [address] [place] [address] Phone: [phone]   Assessment and Plan:     1. Chronic right shoulder pain (Primary) 2. 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  Could consider repeat CSI versus advanced imaging versus NSAID course   Subjective:   I, Sherry Decker, am serving as a Neurosurgeon for Doctor Morene Mace   Chief Complaint: right shoulder pain    HPI:  12/15/2023 Patient is a 63 year old female with right shoulder pain. Patient states right shoulder pain for 3-4 weeks. States she has been swimming and that has helped with arthritis but she has over done it recently. Decreased ROM. Celebrex  and ibu for the pain . Ice helps a little. No numbness or tingling    01/15/2024 Patient states she is good. Has been swimming and doing HEP. She is still having pain but it feels like a therapeutic pain     Relevant Historical Information: Hypothyroidism  Additional pertinent review of systems negative.   Current Outpatient Medications:    acetaminophen  (TYLENOL ) 500 MG tablet, Take 1,500 mg by mouth every 6 (six) hours as needed (severe migraine headaches.)., Disp: , Rfl:    albuterol  (VENTOLIN  HFA) 108 (90 Base) MCG/ACT inhaler, Inhale 2 puffs into the lungs every 6 (six) hours as needed for wheezing or shortness of  breath., Disp: 18 g, Rfl: 0   celecoxib  (CELEBREX ) 200 MG capsule, Take 1 capsule (200 mg total) by mouth daily., Disp: 90 capsule, Rfl: 1   estradiol (ESTRACE) 0.1 MG/GM vaginal cream, Place 1 Applicatorful vaginally., Disp: , Rfl:    OVER THE COUNTER MEDICATION, Take 1 drop by mouth daily as needed (severe migraine pain - patient obtained this in [place] where [facility] is legal). 1 drop has CBD oil 3.5 mg 1 drop has 3.5 mg of THC  Patient takes as last resort for migraines, Disp: , Rfl:    RETIN-A 0.05 % cream, Apply topically at bedtime., Disp: , Rfl:    rivaroxaban  (XARELTO ) 10 MG TABS tablet, Take 1 tablet (10 mg total) by mouth daily., Disp: , Rfl:    Objective:     Vitals:   01/15/24 1255  Weight: 157 lb (71.2 kg)  Height: 5' 6 (1.676 m)      Body mass index is 25.34 kg/m.    Physical Exam:    Gen: Appears well, nad, nontoxic and pleasant Neuro:sensation intact, strength is 5/5 with df/pf/inv/ev, muscle tone wnl Skin: no suspicious lesion or defmority Psych: A&O, appropriate mood and affect  Right shoulder:  No deformity, swelling or muscle wasting No scapular winging FF 180, abd 180, int 0, ext 90 NTTP over the [place], clavicle, ac, coracoid, biceps groove, humerus, deltoid, trapezius, cervical spine Neg neer, hawkins, empty can, obriens, crossarm, subscap liftoff, speeds Neg ant drawer, sulcus sign, apprehension Negative Spurling's test bilat FROM of neck    Electronically signed by:  Odis Mace  D.[facility] 12:58 PM 01/15/24

## 2024-01-16 ENCOUNTER — Encounter: Payer: Self-pay | Admitting: Physician Assistant

## 2024-01-16 ENCOUNTER — Inpatient Hospital Stay: Admission: RE | Admit: 2024-01-16 | Discharge: 2024-01-16 | Attending: Physician Assistant

## 2024-01-16 DIAGNOSIS — Z1231 Encounter for screening mammogram for malignant neoplasm of breast: Secondary | ICD-10-CM

## 2024-01-16 NOTE — Telephone Encounter (Signed)
 Please see pt msg and advise

## 2024-01-17 ENCOUNTER — Other Ambulatory Visit: Payer: Self-pay

## 2024-01-17 DIAGNOSIS — Z1211 Encounter for screening for malignant neoplasm of colon: Secondary | ICD-10-CM

## 2024-01-17 DIAGNOSIS — Z0000 Encounter for general adult medical examination without abnormal findings: Secondary | ICD-10-CM

## 2024-01-17 NOTE — Telephone Encounter (Signed)
 Please see provider msg and schedule pt CPE accordingly

## 2024-01-18 ENCOUNTER — Other Ambulatory Visit (HOSPITAL_COMMUNITY)
Admission: RE | Admit: 2024-01-18 | Discharge: 2024-01-18 | Disposition: A | Discharge status: Home / Self Care | Payer: Self-pay | Source: Ambulatory Visit | Attending: Medical Genetics | Admitting: Medical Genetics

## 2024-01-19 ENCOUNTER — Encounter: Payer: Self-pay | Admitting: Gastroenterology

## 2024-01-19 ENCOUNTER — Other Ambulatory Visit (INDEPENDENT_AMBULATORY_CARE_PROVIDER_SITE_OTHER)

## 2024-01-19 ENCOUNTER — Ambulatory Visit: Admitting: Gastroenterology

## 2024-01-19 ENCOUNTER — Other Ambulatory Visit (HOSPITAL_COMMUNITY): Payer: Self-pay

## 2024-01-19 VITALS — BP 104/66 | HR 99 | Ht 66.0 in | Wt 155.0 lb

## 2024-01-19 DIAGNOSIS — K5900 Constipation, unspecified: Secondary | ICD-10-CM

## 2024-01-19 DIAGNOSIS — R194 Change in bowel habit: Secondary | ICD-10-CM | POA: Diagnosis not present

## 2024-01-19 DIAGNOSIS — Z800 Family history of malignant neoplasm of digestive organs: Secondary | ICD-10-CM | POA: Diagnosis not present

## 2024-01-19 DIAGNOSIS — Z0000 Encounter for general adult medical examination without abnormal findings: Secondary | ICD-10-CM | POA: Diagnosis not present

## 2024-01-19 DIAGNOSIS — K649 Unspecified hemorrhoids: Secondary | ICD-10-CM

## 2024-01-19 DIAGNOSIS — Z83719 Family history of colon polyps, unspecified: Secondary | ICD-10-CM | POA: Diagnosis not present

## 2024-01-19 LAB — CBC WITH DIFFERENTIAL/PLATELET
Basophils Absolute: 0 K/uL (ref 0.0–0.1)
Basophils Relative: 0.5 % (ref 0.0–3.0)
Eosinophils Absolute: 0.1 K/uL (ref 0.0–0.7)
Eosinophils Relative: 1.9 % (ref 0.0–5.0)
HCT: 40 % (ref 36.0–46.0)
Hemoglobin: 13.6 g/dL (ref 12.0–15.0)
Lymphocytes Relative: 33.7 % (ref 12.0–46.0)
Lymphs Abs: 1.5 K/uL (ref 0.7–4.0)
MCHC: 34 g/dL (ref 30.0–36.0)
MCV: 86.6 fl (ref 78.0–100.0)
Monocytes Absolute: 0.4 K/uL (ref 0.1–1.0)
Monocytes Relative: 9.6 % (ref 3.0–12.0)
Neutro Abs: 2.4 K/uL (ref 1.4–7.7)
Neutrophils Relative %: 54.3 % (ref 43.0–77.0)
Platelets: 243 K/uL (ref 150.0–400.0)
RBC: 4.62 Mil/uL (ref 3.87–5.11)
RDW: 12.6 % (ref 11.5–15.5)
WBC: 4.4 K/uL (ref 4.0–10.5)

## 2024-01-19 LAB — COMPREHENSIVE METABOLIC PANEL WITH GFR
ALT: 15 U/L (ref 0–35)
AST: 15 U/L (ref 0–37)
Albumin: 4.4 g/dL (ref 3.5–5.2)
Alkaline Phosphatase: 62 U/L (ref 39–117)
BUN: 14 mg/dL (ref 6–23)
CO2: 31 meq/L (ref 19–32)
Calcium: 9.5 mg/dL (ref 8.4–10.5)
Chloride: 104 meq/L (ref 96–112)
Creatinine, Ser: 0.62 mg/dL (ref 0.40–1.20)
GFR: 94.62 mL/min (ref >60.00)
Glucose, Bld: 93 mg/dL (ref 70–99)
Potassium: 4.5 meq/L (ref 3.5–5.1)
Sodium: 141 meq/L (ref 135–145)
Total Bilirubin: 0.3 mg/dL (ref 0.2–1.2)
Total Protein: 6.9 g/dL (ref 6.0–8.3)

## 2024-01-19 LAB — LIPID PANEL
Cholesterol: 126 mg/dL (ref 0–200)
HDL: 45.3 mg/dL (ref >39.00)
LDL Cholesterol: 51 mg/dL (ref 0–99)
NonHDL: 80.64
Total CHOL/HDL Ratio: 3
Triglycerides: 150 mg/dL — ABNORMAL HIGH (ref 0.0–149.0)
VLDL: 30 mg/dL (ref 0.0–40.0)

## 2024-01-19 LAB — HEMOGLOBIN A1C: Hgb A1c MFr Bld: 6.3 % (ref 4.6–6.5)

## 2024-01-19 MED ORDER — NA SULFATE-K SULFATE-MG SULF 17.5-3.13-1.6 GM/177ML PO SOLN
1.0000 | Freq: Once | ORAL | 0 refills | Status: AC
  Filled 2024-01-19: qty 354, 1d supply, fill #0

## 2024-01-19 NOTE — Progress Notes (Signed)
 Sherry Decker [id number] Sep 06, 1960   Chief Complaint: Discuss colonoscopy  Referring Provider: Allwardt, Mardy HERO, PA-C Primary GI MD: Sampson  HPI: Sherry Decker is a 63 y.o. female with past medical history of anemia, DVT 2022, Graves' disease, hypothyroidism, migraines, OSA, breast reduction surgery and abdominoplasty, tubal ligation 1999 with reversal 2004, hysterectomy 2023 who presents today for colon cancer screening.  Per chart review, Patient is losing Medicaid coverage October 30, hopeful to have colonoscopy before then.  Last colonoscopy done in 2021 in [place]. Unable to read report.   Discussed the use of AI scribe software for clinical note transcription with the patient, who gave verbal consent to proceed.  History of Present Illness Sherry Decker is a 63 year old female who presents for consideration of a repeat colonoscopy.  She had her first colonoscopy in [place], which was normal as far as she knows.  Unable to read report, and she is unsure if she was told when to have a repeat.  She has a family history of colon cancer, with her paternal grandfather and maternal grandmother having died from the disease. Her father had polyps discovered alongside a prostate cancer diagnosis, but they were untreated. No immediate family members have had colon cancer.  She experiences intermittent trouble with hemorrhoids, recently swollen and tender, and finds it difficult to get clean after bowel movement despite using a bidet. She has not used over-the-counter treatments. She reintroduced meat into her diet after a long period of vegetarianism, improving bowel movements and resolving constipation.  States that in the past she would develop migraines, give herself an enema and passed multiple balls of stool, and subsequently her migraine would resolve.  Has not required use of an enema in the last 3 to 4 months.    She denies abdominal pain, nausea, vomiting, or blood in her stool.  No dark black stools or bleeding from hemorrhoids. She underwent pelvic prolapse surgery, with her uterus lifted and mesh placed under her bladder. Her colon was attached to her coccyx during the procedure, leading to occasional tailbone pain during bowel movements, which was not present before surgery.  [place] country.  She is not on Xarelto .  States that she was having some pain of her right shoulder and there was concern about possible DVT so she took 1 dose of Xarelto  and had follow-up imaging study which was negative for DVT, so has not continued on any anticoagulation.  Previous GI Procedures/Imaging      Past Medical History:  Diagnosis Date   Anemia    Dry eyes    pt uses eye drops   DVT (deep venous thrombosis) (HCC) 2022   DVT had drains, was on blood thinner hospitalized for 30 days, obtained a super bug that could noty be identified- was in [place].   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  Wheelchair dependent     Past Surgical History:  Procedure Laterality Date   Abdominaloplasty     BREAST REDUCTION SURGERY     CHEILECTOMY Right 10/26/2022   Procedure: RIGHT GREAT TOE CHEILECTOMY;  Surgeon: Harden Jerona GAILS, MD;  Location: [facility];  Service: Orthopedics;  Laterality: Right;   COLONOSCOPY  10/2019   pelvic colon blader, colon repair      reversal of tubal ligation  2004   TUBAL LIGATION  1999   VAGINAL HYSTERECTOMY  2023    Current Outpatient Medications  Medication Sig Dispense Refill    acetaminophen  (TYLENOL ) 500 MG tablet Take 1,500 mg by mouth every 6 (six) hours as needed (severe migraine headaches.). (Patient not taking: Reported on 01/19/2024)     albuterol  (VENTOLIN  HFA) 108 (90 Base) MCG/ACT inhaler Inhale 2 puffs into the lungs every 6 (six) hours as needed for wheezing or shortness of breath. (Patient not taking: Reported on 01/19/2024) 18 g 0   celecoxib  (CELEBREX ) 200 MG capsule Take 1 capsule (200 mg total) by mouth daily. (Patient not taking: Reported on 01/19/2024) 90 capsule 1   estradiol (ESTRACE) 0.1 MG/GM vaginal cream Place 1 Applicatorful vaginally. (Patient not taking: Reported on 01/19/2024)     OVER THE COUNTER MEDICATION Take 1 drop by mouth daily as needed (severe migraine pain - patient obtained this in [place] where [facility] is legal). 1 drop has CBD oil 3.5 mg 1 drop has 3.5 mg of THC  Patient takes as last resort for migraines (Patient not taking: Reported on 01/19/2024)     RETIN-A 0.05 % cream Apply topically at bedtime. (Patient not taking: Reported on 01/19/2024)     rivaroxaban  (XARELTO ) 10 MG TABS tablet Take 1 tablet (10 mg total) by mouth daily. (Patient not taking: Reported on 01/19/2024)     No current facility-administered medications for this visit.    Allergies as of 01/19/2024 - Review Complete 01/19/2024  Allergen Reaction Noted   Codeine Nausea And Vomiting 09/13/2022   Penicillins Rash 09/13/2022    Family History  Problem Relation Age of Onset   Hyperthyroidism Mother        passed with patient was 1   Prostate cancer Father    CAD Father    Valvular heart disease Father    Skin cancer Father    Diabetes type II Sister        insulin pump; way worse after gestation diabetes   Testicular cancer Brother    Colon cancer Maternal Grandmother    Colon cancer Paternal Grandfather    Diabetes type II Paternal Grandmother     Social History   Tobacco Use   Smoking status: Never   Smokeless tobacco: Never  Vaping Use    Vaping status: Never Used  Substance Use Topics   Alcohol use: Not Currently   Drug use: Yes    Types: Other-see comments    Comment: CBD THC drops - last time 09/13/22-  for migraines     Review of Systems:    Constitutional: No unexplained weight loss, fever, chills Cardiovascular: No chest pain Respiratory: No SOB Gastrointestinal: See HPI and otherwise negative   Physical Exam:  Vital signs: BP 104/66   Pulse 99   Ht 5' 6 (1.676 m)   Wt 155 lb (70.3 kg)   LMP  (LMP Unknown)   BMI 25.02 kg/m   Wt Readings from Last 3 Encounters:  01/19/24 155 lb (70.3 kg)  01/15/24 157 lb (71.2 kg)  12/15/23  159 lb (72.1 kg)     Constitutional: Pleasant, well-appearing female in NAD, alert and cooperative Head:  Normocephalic and atraumatic.  Eyes: No scleral icterus.  Respiratory: Respirations even and unlabored. Lungs clear to auscultation bilaterally.  No wheezes, crackles, or rhonchi.  Cardiovascular:  Regular rate and rhythm. No murmurs. No peripheral edema. Gastrointestinal:  Soft, nondistended, nontender. No rebound or guarding. Normal bowel sounds. No appreciable masses or hepatomegaly. Rectal:  Deferred to colonoscopy. Neurologic:  Alert and oriented x4;  grossly normal neurologically.  Skin:   Dry and intact without significant lesions or rashes. Psychiatric: Oriented to person, place and time. Demonstrates good judgement and reason without abnormal affect or behaviors.   RELEVANT LABS AND IMAGING: CBC    Component Value Date/Time   WBC 3.7 (L) 10/26/2022 0603   RBC 4.19 10/26/2022 0603   HGB 12.6 10/26/2022 0603   HCT 37.6 10/26/2022 0603   PLT 199 10/26/2022 0603   MCV 89.7 10/26/2022 0603   MCH 30.1 10/26/2022 0603   MCHC 33.5 10/26/2022 0603   RDW 13.2 10/26/2022 0603   LYMPHSABS 1.3 06/17/2022 0914   MONOABS 0.3 06/17/2022 0914   EOSABS 0.1 06/17/2022 0914   BASOSABS 0.0 06/17/2022 0914    CMP     Component Value Date/Time   NA 141 06/17/2022 0914   K  4.3 06/17/2022 0914   CL 104 06/17/2022 0914   CO2 29 06/17/2022 0914   GLUCOSE 107 (H) 06/17/2022 0914   BUN 19 06/17/2022 0914   CREATININE 0.84 06/17/2022 0914   CALCIUM 9.5 06/17/2022 0914   PROT 7.0 06/17/2022 0914   ALBUMIN 4.5 06/17/2022 0914   AST 14 06/17/2022 0914   ALT 8 06/17/2022 0914   ALKPHOS 52 06/17/2022 0914   BILITOT 0.6 06/17/2022 0914   GFRNONAA >60 02/20/2009 0825   GFRAA  02/20/2009 0825    >60        The eGFR has been calculated using the MDRD equation. This calculation has not been validated in all clinical situations. eGFR's persistently <60 mL/min signify possible Chronic Kidney Disease.     Assessment/Plan:   Family history of colon cancer Family history of colon polyps Change in bowel habits Constipation Hemorrhoids Patient seen today for consideration of colonoscopy.  Last colonoscopy, and her only colonoscopy, done in 2021 in [place].  Unable to read report.  She does not recall if there were any abnormal findings and was not told when to have repeat.  She does have family history of colon cancer in her paternal grandfather and maternal grandmother.  Her father has had colon polyps. She has previously had issues with constipation, though this has improved over the last 3 to 4 months with dietary changes.  Does sometimes have pain in her low back with bowel movements which is new since she had pelvic organ prolapse surgery.  States that her colon was tacked to her coccyx during the procedure. [place]. She has hemorrhoids which occasionally flare and have been present for years.  Not using anything OTC for this at this time.  Denies any rectal bleeding or melena.  No abdominal pain, no other issues. She will lose insurance coverage at the end of this month and hopes to have procedure before then.  - Schedule colonoscopy. I thoroughly discussed the procedure with the patient to include nature of the procedure, alternatives, benefits, and  risks (including but not limited to bleeding, infection, perforation, anesthesia/cardiac/pulmonary complications). Patient verbalized understanding and gave verbal consent  to proceed with procedure.  - Update labs: CBC, CMP   Camie Furbish, PA-C  Gastroenterology 01/19/2024, 2:52 PM  Patient Care Team: Allwardt, Mardy HERO, PA-C as PCP - General (Physician Assistant)

## 2024-01-19 NOTE — Patient Instructions (Signed)
 Your provider has requested that you go to the basement level for lab work before leaving today. Press B on the elevator. The lab is located at the first door on the left as you exit the elevator.   You have been scheduled for a colonoscopy. Please follow written instructions given to you at your visit today.   If you use inhalers (even only as needed), please bring them with you on the day of your procedure.  DO NOT TAKE 7 DAYS PRIOR TO TEST- Trulicity (dulaglutide) Ozempic, Wegovy (semaglutide) Mounjaro (tirzepatide) Bydureon Bcise (exanatide extended release)  DO NOT TAKE 1 DAY PRIOR TO YOUR TEST Rybelsus (semaglutide) Adlyxin (lixisenatide) Victoza (liraglutide) Byetta (exanatide) ___________________________________________________________________________  _______________________________________________________  If your blood pressure at your visit was 140/90 or greater, please contact your primary care physician to follow up on this.  _______________________________________________________  If you are age 67 or older, your body mass index should be between 23-30. Your Body mass index is 25.02 kg/m. If this is out of the aforementioned range listed, please consider follow up with your Primary Care Provider.  If you are age 66 or younger, your body mass index should be between 19-25. Your Body mass index is 25.02 kg/m. If this is out of the aformentioned range listed, please consider follow up with your Primary Care Provider.   ________________________________________________________  The [facility] providers would like to encourage you to use MYCHART to communicate with providers for non-urgent requests or questions.  Due to long hold times on the telephone, sending your provider a message by [facility] may be a faster and more efficient way to get a response.  Please allow 48 business hours for a response.  Please remember that this is for non-urgent requests.   _______________________________________________________  [facility] is using a team-based approach to care.  Your team is made up of your doctor and two to three APPS. Our APPS (Nurse Practitioners and Physician Assistants) work with your physician to ensure care continuity for you. They are fully qualified to address your health concerns and develop a treatment plan. They communicate directly with your gastroenterologist to care for you. Seeing the Advanced Practice Practitioners on your physician's team can help you by facilitating care more promptly, often allowing for earlier appointments, access to diagnostic testing, procedures, and other specialty referrals.

## 2024-01-22 ENCOUNTER — Other Ambulatory Visit

## 2024-01-22 ENCOUNTER — Telehealth: Payer: Self-pay

## 2024-01-22 ENCOUNTER — Ambulatory Visit: Payer: Self-pay | Admitting: Gastroenterology

## 2024-01-22 ENCOUNTER — Ambulatory Visit: Payer: Self-pay | Admitting: Physician Assistant

## 2024-01-22 LAB — TSH: TSH: <0.01 u[IU]/mL — ABNORMAL LOW (ref 0.35–5.50)

## 2024-01-22 NOTE — Telephone Encounter (Signed)
 Please see pt response as Lorain Childes

## 2024-01-22 NOTE — Telephone Encounter (Signed)
 Per Alyssa pt above does not need to come in today for labs, already collected nothing further needed at this time. Once Alyssa has had a chance to review her results we will reach out to her directly. Please call pt and cancel lab only appt

## 2024-01-24 ENCOUNTER — Ambulatory Visit: Admitting: Gastroenterology

## 2024-01-24 ENCOUNTER — Encounter: Payer: Self-pay | Admitting: Gastroenterology

## 2024-01-24 VITALS — BP 100/71 | HR 78 | Temp 98.1°F | Resp 16 | Ht 66.0 in | Wt 155.0 lb

## 2024-01-24 DIAGNOSIS — K648 Other hemorrhoids: Secondary | ICD-10-CM

## 2024-01-24 DIAGNOSIS — Z83719 Family history of colon polyps, unspecified: Secondary | ICD-10-CM

## 2024-01-24 DIAGNOSIS — Z1211 Encounter for screening for malignant neoplasm of colon: Secondary | ICD-10-CM

## 2024-01-24 DIAGNOSIS — D120 Benign neoplasm of cecum: Secondary | ICD-10-CM

## 2024-01-24 DIAGNOSIS — K621 Rectal polyp: Secondary | ICD-10-CM

## 2024-01-24 DIAGNOSIS — D128 Benign neoplasm of rectum: Secondary | ICD-10-CM

## 2024-01-24 DIAGNOSIS — R194 Change in bowel habit: Secondary | ICD-10-CM

## 2024-01-24 DIAGNOSIS — Z800 Family history of malignant neoplasm of digestive organs: Secondary | ICD-10-CM

## 2024-01-24 DIAGNOSIS — K649 Unspecified hemorrhoids: Secondary | ICD-10-CM

## 2024-01-24 DIAGNOSIS — K644 Residual hemorrhoidal skin tags: Secondary | ICD-10-CM

## 2024-01-24 DIAGNOSIS — K5900 Constipation, unspecified: Secondary | ICD-10-CM

## 2024-01-24 MED ORDER — SODIUM CHLORIDE 0.9 % IV SOLN: 500.0000 mL | Freq: Once | INTRAVENOUS | Status: DC

## 2024-01-24 NOTE — Progress Notes (Unsigned)
 Sedate, gd SR, tolerated procedure well, VSS, report to RN

## 2024-01-24 NOTE — Op Note (Signed)
 [facility] Patient Name: Sherry Decker Procedure Date: 01/24/2024 3:45 PM MRN: [id number] Endoscopist: Gustav ALONSO Mcgee , MD, [id number] Age: 63 Referring MD:  Date of Birth: July 19, 1960 Gender: Female Account #: [id number] Procedure:                Colonoscopy Indications:              Screening for colon cancer: Family history of                            colorectal cancer in multiple 2nd degree relatives,                            Colon cancer screening in patient at increased                            risk: Family history of 1st-degree relative with                            colon polyps before age 33 years Medicines:                Monitored Anesthesia Care Procedure:                Pre-Anesthesia Assessment:                           - Prior to the procedure, a History and Physical                            was performed, and patient medications and                            allergies were reviewed. The patient's tolerance of                            previous anesthesia was also reviewed. The risks                            and benefits of the procedure and the sedation                            options and risks were discussed with the patient.                            All questions were answered, and informed consent                            was obtained. Prior Anticoagulants: The patient has                            taken no anticoagulant or antiplatelet agents. ASA                            Grade Assessment: II - A patient with mild systemic  disease. After reviewing the risks and benefits,                            the patient was deemed in satisfactory condition to                            undergo the procedure.                           After obtaining informed consent, the colonoscope                            was passed under direct vision. Throughout the                            procedure, the patient's blood  pressure, pulse, and                            oxygen saturations were monitored continuously. The                            [id number]-HQ190L Colonoscope [id number] was introduced                            through the anus and advanced to the the cecum,                            identified by appendiceal orifice and ileocecal                            valve. The colonoscopy was performed without                            difficulty. The patient tolerated the procedure                            well. The quality of the bowel preparation was                            good. The ileocecal valve, appendiceal orifice, and                            rectum were photographed. Scope In: 3:49:32 PM Scope Out: 4:04:07 PM Scope Withdrawal Time: 0 hours 9 minutes 1 second  Total Procedure Duration: 0 hours 14 minutes 35 seconds  Findings:                 The perianal and digital rectal examinations were                            normal.                           A 1 mm polyp was found in the cecum. The polyp was  sessile. The polyp was removed with a cold biopsy                            forceps. Resection and retrieval were complete.                           Three sessile polyps were found in the rectum. The                            polyps were 1 to 2 mm in size. These polyps were                            removed with a cold biopsy forceps. Resection and                            retrieval were complete.                           Non-bleeding external and internal hemorrhoids were                            found during retroflexion. The hemorrhoids were                            small. Complications:            No immediate complications. Estimated Blood Loss:     Estimated blood loss was minimal. Impression:               - One 1 mm polyp in the cecum, removed with a cold                            biopsy forceps. Resected and retrieved.                            - Three 1 to 2 mm polyps in the rectum, removed                            with a cold biopsy forceps. Resected and retrieved.                           - Non-bleeding external and internal hemorrhoids. Recommendation:           - Patient has a contact number available for                            emergencies. The signs and symptoms of potential                            delayed complications were discussed with the                            patient. Return to normal activities tomorrow.  Written discharge instructions were provided to the                            patient.                           - Resume previous diet.                           - Continue present medications.                           - Await pathology results.                           - Repeat colonoscopy in 5 years for surveillance                            based on pathology results. Sheccid Lahmann V. Ebony Yorio, MD 01/24/2024 4:08:31 PM This report has been signed electronically.

## 2024-01-24 NOTE — Progress Notes (Signed)
 Called to room to assist during endoscopic procedure.  Patient ID and intended procedure confirmed with present staff. Received instructions for my participation in the procedure from the performing physician.

## 2024-01-24 NOTE — Progress Notes (Unsigned)
 [facility] History and Physical   Primary Care Physician:  Allwardt, Mardy HERO, PA-C   Reason for Procedure:  Family history of colon cancer  Plan:    Screening colonoscopy with possible interventions as needed     HPI: Sherry Decker is a very pleasant 63 y.o. female here for screening colonoscopy.   The risks and benefits as well as alternatives of endoscopic procedure(s) have been discussed and reviewed. All questions answered. The patient agrees to proceed.    Past Medical History:  Diagnosis Date   Anemia    Dry eyes    pt uses eye drops   DVT (deep venous thrombosis) (HCC) 2022   DVT had drains, was on blood thinner hospitalized for 30 days, obtained a super bug that could noty be identified- was in [place].   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 Authorizing Provider  estradiol (ESTRACE) 0.1 MG/GM vaginal cream Place 1 Applicatorful vaginally.   Yes [provider]   acetaminophen  (TYLENOL ) 500 MG tablet Take 1,500 mg by mouth every 6 (six) hours as needed (severe migraine headaches.). Patient not taking: No sig reported    [provider]  albuterol  (VENTOLIN  HFA) 108 (90 Base) MCG/ACT inhaler Inhale 2 puffs into the lungs every 6 (six) hours as needed for wheezing or shortness of breath. Patient not taking: No sig reported 11/30/23   Kennyth Worth HERO, MD  celecoxib  (CELEBREX ) 200 MG capsule Take 1 capsule (200 mg total) by mouth daily. Patient not taking: Reported on 01/19/2024 12/01/23   Allwardt, Alyssa M, PA-C  OVER THE COUNTER MEDICATION Take 1 drop by mouth daily as needed (severe migraine pain - patient obtained this in [place] where [facility] is legal). 1 drop has CBD oil 3.5 mg 1 drop has 3.5 mg of THC  Patient takes as last resort for migraines Patient not taking: No sig reported    [provider]  RETIN-A 0.05 % cream Apply topically at bedtime. Patient not taking: No sig reported 09/15/23   [provider]    Current Outpatient Medications  Medication Sig Dispense Refill   estradiol (ESTRACE) 0.1 MG/GM vaginal cream Place 1 Applicatorful vaginally.     acetaminophen  (TYLENOL ) 500 MG tablet Take 1,500 mg by mouth every 6 (six) hours  as needed (severe migraine headaches.). (Patient not taking: No sig reported)     albuterol  (VENTOLIN  HFA) 108 (90 Base) MCG/ACT inhaler Inhale 2 puffs into the lungs every 6 (six) hours as needed for wheezing or shortness of breath. (Patient not taking: No sig reported) 18 g 0   celecoxib  (CELEBREX ) 200 MG capsule Take 1 capsule (200 mg total) by mouth daily. (Patient not taking: Reported on 01/19/2024) 90 capsule 1   OVER THE COUNTER MEDICATION Take 1 drop by mouth daily as needed (severe migraine pain - patient obtained this in [place] where [facility] is legal). 1 drop has CBD oil 3.5 mg 1 drop has 3.5 mg of THC  Patient takes as last resort for migraines (Patient not taking: No sig reported)      RETIN-A 0.05 % cream Apply topically at bedtime. (Patient not taking: No sig reported)     Current Facility-Administered Medications  Medication Dose Route Frequency Provider Last Rate Last Admin   0.9 %  sodium chloride  infusion  500 mL Intravenous Once Elanie Hammitt V, MD        Allergies as of 01/24/2024 - Review Complete 01/24/2024  Allergen Reaction Noted   Codeine Nausea And Vomiting 09/13/2022   Penicillins Rash 09/13/2022    Family History  Problem Relation Age of Onset   Hyperthyroidism Mother        passed with patient was 42   Colon polyps Father    Prostate cancer Father    CAD Father    Valvular heart disease Father    Skin cancer Father    Diabetes type II Sister        insulin pump; way worse after gestation diabetes   Testicular cancer Brother    Colon cancer Maternal Grandmother    Diabetes type II Paternal Grandmother    Colon cancer Paternal Grandfather    Esophageal cancer Neg Hx    Rectal cancer Neg Hx    Stomach cancer Neg Hx     Social History   Socioeconomic History   Marital status: Married    Spouse name: Not on file   Number of children: 4   Years of education: Not on file   Highest education level: Not on file  Occupational History   Occupation: retired  Tobacco Use   Smoking status: Never   Smokeless tobacco: Never  Vaping Use   Vaping status: Never Used  Substance and Sexual Activity   Alcohol use: Not Currently   Drug use: Yes    Types: Other-see comments    Comment: CBD THC drops - last time 09/13/22-  for migraines   Sexual activity: Yes    Birth control/protection: Surgical    Comment: Hysterectomy  Other Topics Concern   Not on file  Social History Narrative   Not on file   Social Drivers of Health   Financial Resource Strain: High Risk (04/22/2022)   Overall Financial Resource Strain (CARDIA)    Difficulty of Paying Living Expenses: Very hard  Food Insecurity: No Food Insecurity (04/22/2022)   Hunger Vital Sign     Worried About Running Out of Food in the Last Year: Never true    Ran Out of Food in the Last Year: Never true  Transportation Needs: No Transportation Needs (04/22/2022)   PRAPARE - Administrator, Civil Service (Medical): No    Lack of Transportation (Non-Medical): No  Physical Activity: Not on file  Stress: Stress Concern Present (04/22/2022)   [employer] of  Occupational Health - Occupational Stress Questionnaire    Feeling of Stress : To some extent  Social Connections: Not on file  Intimate Partner Violence: Not on file    Review of Systems:  All other review of systems negative except as mentioned in the HPI.  Physical Exam: Vital signs in last 24 hours: BP (!) 140/61   Pulse 80   Temp 98.1 F (36.7 C)   Ht 5' 6 (1.676 m)   Wt 155 lb (70.3 kg)   LMP  (LMP Unknown)   SpO2 96%   BMI 25.02 kg/m  General:   Alert, NAD Lungs:  Clear .   Heart:  Regular rate and rhythm Abdomen:  Soft, nontender and nondistended. Neuro/Psych:  Alert and cooperative. Normal mood and affect. A and O x 3  Reviewed labs, radiology imaging, old records and pertinent past GI work up  Patient is appropriate for planned procedure(s) and anesthesia in an ambulatory setting   K. Veena Shyrl Obi , MD [phone]

## 2024-01-24 NOTE — Patient Instructions (Signed)
 YOU HAD AN ENDOSCOPIC PROCEDURE TODAY AT THE [facility]:   Refer to the procedure report that was given to you for any specific questions about what was found during the examination.  If the procedure report does not answer your questions, please call your gastroenterologist to clarify.  If you requested that your care partner not be given the details of your procedure findings, then the procedure report has been included in a sealed envelope for you to review at your convenience later.  YOU SHOULD EXPECT: Some feelings of bloating in the abdomen. Passage of more gas than usual.  Walking can help get rid of the air that was put into your GI tract during the procedure and reduce the bloating. If you had a lower endoscopy (such as a colonoscopy or flexible sigmoidoscopy) you may notice spotting of blood in your stool or on the toilet paper. If you underwent a bowel prep for your procedure, you may not have a normal bowel movement for a few days.  Please Note:  You might notice some irritation and congestion in your nose or some drainage.  This is from the oxygen used during your procedure.  There is no need for concern and it should clear up in a day or so.  SYMPTOMS TO REPORT IMMEDIATELY:  Following lower endoscopy (colonoscopy or flexible sigmoidoscopy):  Excessive amounts of blood in the stool  Significant tenderness or worsening of abdominal pains  Swelling of the abdomen that is new, acute  Fever of 100F or higher  Resume previous diet Continue present medications Await pathology results Repeat colonoscopy in 5 years Handout on polyps and hemorrhoids given  For urgent or emergent issues, a gastroenterologist can be reached at any hour by calling [phone]. Do not use MyChart messaging for urgent concerns.    DIET:  We do recommend a small meal at first, but then you may proceed to your regular diet.  Drink plenty of fluids but you should avoid alcoholic beverages for 24  hours.  ACTIVITY:  You should plan to take it easy for the rest of today and you should NOT DRIVE or use heavy machinery until tomorrow (because of the sedation medicines used during the test).    FOLLOW UP: Our staff will call the number listed on your records the next business day following your procedure.  We will call around 7:15- 8:00 am to check on you and address any questions or concerns that you may have regarding the information given to you following your procedure. If we do not reach you, we will leave a message.     If any biopsies were taken you will be contacted by phone or by letter within the next 1-3 weeks.  Please call us  at [phone] if you have not heard about the biopsies in 3 weeks.    SIGNATURES/CONFIDENTIALITY: You and/or your care partner have signed paperwork which will be entered into your electronic medical record.  These signatures attest to the fact that that the information above on your After Visit Summary has been reviewed and is understood.  Full responsibility of the confidentiality of this discharge information lies with you and/or your care-partner.

## 2024-01-24 NOTE — Progress Notes (Unsigned)
 Pt's states no medical or surgical changes since previsit or office visit.

## 2024-01-25 ENCOUNTER — Encounter: Payer: Self-pay | Admitting: Physician Assistant

## 2024-01-25 ENCOUNTER — Ambulatory Visit (INDEPENDENT_AMBULATORY_CARE_PROVIDER_SITE_OTHER): Admitting: Physician Assistant

## 2024-01-25 ENCOUNTER — Other Ambulatory Visit (HOSPITAL_COMMUNITY): Payer: Self-pay

## 2024-01-25 ENCOUNTER — Telehealth: Payer: Self-pay

## 2024-01-25 VITALS — BP 120/70 | HR 106 | Temp 98.0°F | Ht 66.0 in | Wt 147.2 lb

## 2024-01-25 DIAGNOSIS — Z0000 Encounter for general adult medical examination without abnormal findings: Secondary | ICD-10-CM | POA: Diagnosis not present

## 2024-01-25 DIAGNOSIS — R7303 Prediabetes: Secondary | ICD-10-CM | POA: Insufficient documentation

## 2024-01-25 DIAGNOSIS — R000 Tachycardia, unspecified: Secondary | ICD-10-CM

## 2024-01-25 DIAGNOSIS — R7989 Other specified abnormal findings of blood chemistry: Secondary | ICD-10-CM | POA: Diagnosis not present

## 2024-01-25 DIAGNOSIS — H5713 Ocular pain, bilateral: Secondary | ICD-10-CM

## 2024-01-25 DIAGNOSIS — K5900 Constipation, unspecified: Secondary | ICD-10-CM

## 2024-01-25 DIAGNOSIS — Z23 Encounter for immunization: Secondary | ICD-10-CM | POA: Diagnosis not present

## 2024-01-25 DIAGNOSIS — R194 Change in bowel habit: Secondary | ICD-10-CM

## 2024-01-25 DIAGNOSIS — E0500 Thyrotoxicosis with diffuse goiter without thyrotoxic crisis or storm: Secondary | ICD-10-CM

## 2024-01-25 LAB — GLUCOSE, POCT (MANUAL RESULT ENTRY): POC Glucose: 81 mg/dL (ref 70–99)

## 2024-01-25 MED ORDER — METOPROLOL TARTRATE 25 MG PO TABS
25.0000 mg | ORAL_TABLET | Freq: Two times a day (BID) | ORAL | 2 refills | Status: DC
  Filled 2024-01-25: qty 30, 15d supply, fill #0
  Filled 2024-02-05: qty 30, 15d supply, fill #1

## 2024-01-25 NOTE — Telephone Encounter (Signed)
 No answer, left message to call if having any issues or concerns, B.Vester Titsworth RN

## 2024-01-25 NOTE — Progress Notes (Signed)
 Patient ID: Sherry Decker, female    DOB: 09-08-1960, 63 y.o.   MRN: [id number]   Assessment & Plan:  Annual physical exam  Prediabetes -     Ambulatory referral to Endocrinology -     Comprehensive metabolic panel with GFR -     HJI34, IA-2, and Insulin Autoantibody serum -     POCT glucose (manual entry)  Graves' disease -     Ambulatory referral to Endocrinology -     TSH -     T3, free -     T4, free -     Thyrotropin receptor autoabs -     GAD65, IA-2, and Insulin Autoantibody serum -     Ambulatory referral to Ophthalmology -     Metoprolol Tartrate; Take 1 tablet (25 mg total) by mouth 2 (two) times daily.  Dispense: 30 tablet; Refill: 2  Low TSH level -     Ambulatory referral to Endocrinology -     TSH -     T3, free -     T4, free -     Thyrotropin receptor autoabs -     Ambulatory referral to Ophthalmology  Immunization due -     Flu vaccine trivalent PF, 6mos and older(Flulaval,Afluria,Fluarix,Fluzone)  Eye pain, bilateral -     Ambulatory referral to Ophthalmology  Tachycardia -     Metoprolol Tartrate; Take 1 tablet (25 mg total) by mouth 2 (two) times daily.  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 Labs show low TSH, suggesting hyperthyroidism recurrence. Concerns about thyroid storm and thyroid eye disease discussed. 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 Differential  includes type 2 diabetes versus latent autoimmune diabetes. Interested in further evaluation to distinguish type. - Order GAD-65, IA-2, and insulin autoantibody tests to evaluate for autoimmune diabetes - Provide sample of continuous glucose monitor [facility] for two weeks to monitor glucose levels Lab Results  Component Value Date   HGBA1C 6.3 01/19/2024    Age-appropriate screening and counseling performed today. Will check labs and call with results. Preventive measures discussed and printed in AVS for patient.   Patient Counseling: [x]   Nutrition: Stressed importance of moderation in sodium/caffeine intake, saturated fat and cholesterol, caloric balance, sufficient intake of fresh fruits, vegetables, and fiber.  [x]   Stressed the importance of regular exercise.   []   Substance Abuse: Discussed cessation/primary prevention of tobacco, alcohol, or other drug use; driving or other dangerous activities under the influence; availability of treatment for abuse.   []   Injury prevention: Discussed safety belts, safety helmets, smoke detector, smoking near bedding or upholstery.   []   Sexuality: Discussed sexually transmitted diseases, partner selection, use of condoms, avoidance of unintended pregnancy  and contraceptive alternatives.   [x]   Dental health: Discussed importance of regular tooth brushing, flossing, and dental visits.  [x]   Health maintenance and immunizations reviewed. Please refer to Health maintenance section.       F/up 4 weeks or  prn    After blood work, patient left and said she got around 10-15 minutes down the road, then returned to the office complaining of severe chest pain. She was taken to an exam room, EMS was called. Her husband was called. Patient was taken by EMS to nearest hospital.    Subjective:    Chief Complaint  Patient presents with   Annual Exam    Physical; want to discuss lab results from colonoscopy last week; may want to discuss heart palpitations  x1mo    HPI Discussed the use of AI scribe software for clinical note transcription with the patient, who gave verbal consent to proceed.  History of Present Illness Sherry Decker is a 63 year old female who presents for her annual physical exam with concerns of prediabetes and low TSH levels.  She has had fasting blood work done prior to the appointment, which revealed prediabetes and a low TSH level. She is particularly concerned about these lab results due to her strong family history of diabetes. Her sister has been on an insulin pump since age 35 despite being very active and healthy, and her sister's son developed diabetes at age 46. She has been vigilant about her own health, getting her A1c checked annually for many years, and it has always been normal.  She has been proactive in managing her health by swimming an hour daily and following a diet low in carbs, gluten, and refined sugars. She describes herself as 'living as if I had diabetes' for the past six months, adhering to a diet high in fiber and protein, and avoiding sweets unless followed by exercise.  She has a history of Graves' disease diagnosed 18-19 years ago following a thyroid storm after a severe accident. She was treated with methimazole for 12 months and has had stable thyroid function since then until the recent lab work showed low TSH. She experiences palpitations, feeling hot internally, and episodes of yawning and hand shaking, particularly in the mornings. She also mentions joint pain, which she attributes to her swimming regimen, and difficulty sleeping, which she tries to manage with a CPAP machine.  She has experienced sharp eye pain and dry eyes upon waking, which she initially attributed to her CPAP mask. She has a history of tinnitus, which has worsened recently, and she describes feeling flushed and having redness in her eyes.     Past Medical History:  Diagnosis Date   Anemia    Dry eyes    pt uses eye  drops   DVT (deep venous thrombosis) (HCC) 2022   DVT had drains, was on blood thinner hospitalized for 30 days, obtained a super bug that could noty be identified- was in [place].   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   Wheelchair dependent     Past Surgical History:  Procedure Laterality Date   Abdominaloplasty  BREAST REDUCTION SURGERY     CHEILECTOMY Right 10/26/2022   Procedure: RIGHT GREAT TOE CHEILECTOMY;  Surgeon: Harden Jerona GAILS, MD;  Location: [facility] OR;  Service: Orthopedics;  Laterality: Right;   COLONOSCOPY  10/2019   pelvic colon blader, colon repair      reversal of tubal ligation  2004   TUBAL LIGATION  1999   VAGINAL HYSTERECTOMY  2023    Family History  Problem Relation Age of Onset   Hyperthyroidism Mother        passed with patient was 72   Colon polyps Father    Prostate cancer Father    CAD Father    Valvular heart disease Father    Skin cancer Father    Diabetes type II Sister        insulin pump; way worse after gestation diabetes   Testicular cancer Brother    Colon cancer Maternal Grandmother    Diabetes type II Paternal Grandmother    Colon cancer Paternal Grandfather    Esophageal cancer Neg Hx    Rectal cancer Neg Hx    Stomach cancer Neg Hx     Social History   Tobacco Use   Smoking status: Never   Smokeless tobacco: Never  Vaping Use   Vaping status: Never Used  Substance Use Topics   Alcohol use: Not Currently   Drug use: Yes    Types: Other-see comments    Comment: CBD THC  drops - last time 09/13/22-  for migraines     Allergies  Allergen Reactions   Codeine Nausea And Vomiting   Penicillins Rash    Review of Systems NEGATIVE UNLESS OTHERWISE INDICATED IN HPI      Objective:     BP 120/70   Pulse (!) 106   Temp 98 F (36.7 C)   Ht 5' 6 (1.676 m)   Wt 147 lb 3.2 oz (66.8 kg)   LMP  (LMP Unknown)   SpO2 96%   BMI 23.76 kg/m   Wt Readings from Last 3 Encounters:  01/25/24 147 lb 3.2 oz (66.8 kg)  01/24/24 155 lb (70.3 kg)  01/19/24 155 lb (70.3 kg)    BP Readings from Last 3 Encounters:  01/25/24 120/70  01/24/24 100/71  01/19/24 104/66     Physical Exam Vitals and nursing note reviewed.  Constitutional:      Appearance: Normal appearance. She is normal weight. She is not toxic-appearing.  HENT:     Head: Normocephalic and atraumatic.     Right Ear: Tympanic membrane, ear canal and external ear normal.     Left Ear: Tympanic membrane, ear canal and external ear normal.     Nose: Nose normal.     Mouth/Throat:     Mouth: Mucous membranes are moist.  Eyes:     Extraocular Movements: Extraocular movements intact.     Conjunctiva/sclera: Conjunctivae normal.     Pupils: Pupils are equal, round, and reactive to light.  Neck:     Thyroid: Thyromegaly present. No thyroid tenderness.  Cardiovascular:     Rate and Rhythm: Regular rhythm. Tachycardia present.  Pulmonary:     Effort: Pulmonary effort is normal.     Breath sounds: Normal breath sounds.  Abdominal:     General: Abdomen is flat. Bowel sounds are normal.     Palpations: Abdomen is soft.  Musculoskeletal:        General: Normal range of motion.     Cervical back: Normal range of motion and  neck supple.  Skin:    General: Skin is warm and dry.  Neurological:     General: No focal deficit present.     Mental Status: She is alert and oriented to person, place, and time.  Psychiatric:        Mood and Affect: Mood is anxious.        Maiyah Goyne M Saahas Hidrogo, PA-C

## 2024-01-26 ENCOUNTER — Other Ambulatory Visit (HOSPITAL_COMMUNITY): Payer: Self-pay

## 2024-01-26 LAB — COMPREHENSIVE METABOLIC PANEL WITH GFR
ALT: 17 U/L (ref 0–35)
AST: 19 U/L (ref 0–37)
Albumin: 4.7 g/dL (ref 3.5–5.2)
Alkaline Phosphatase: 68 U/L (ref 39–117)
BUN: 15 mg/dL (ref 6–23)
CO2: 23 meq/L (ref 19–32)
Calcium: 9.6 mg/dL (ref 8.4–10.5)
Chloride: 101 meq/L (ref 96–112)
Creatinine, Ser: 0.65 mg/dL (ref 0.40–1.20)
GFR: 93.53 mL/min (ref >60.00)
Glucose, Bld: 75 mg/dL (ref 70–99)
Potassium: 4.7 meq/L (ref 3.5–5.1)
Sodium: 140 meq/L (ref 135–145)
Total Bilirubin: 0.9 mg/dL (ref 0.2–1.2)
Total Protein: 7 g/dL (ref 6.0–8.3)

## 2024-01-26 LAB — T3, FREE: T3, Free: 7.6 pg/mL — ABNORMAL HIGH (ref 2.3–4.2)

## 2024-01-26 LAB — THYROTROPIN RECEPTOR AUTOABS: Thyrotropin Receptor Ab: 5.81 IU/L — ABNORMAL HIGH (ref 0.00–1.75)

## 2024-01-26 LAB — T4, FREE: Free T4: 2.79 ng/dL — ABNORMAL HIGH (ref 0.60–1.60)

## 2024-01-26 LAB — TSH: TSH: <0.01 u[IU]/mL — ABNORMAL LOW (ref 0.35–5.50)

## 2024-01-26 NOTE — Telephone Encounter (Signed)
 Please see pt msg and advise

## 2024-01-28 LAB — GLUTAMIC ACID DECARBOXYLASE AUTO ABS: Glutamic Acid Decarb Ab: <5 [IU]/mL (ref <5)

## 2024-01-28 LAB — IA-2 ANTIBODY

## 2024-01-28 LAB — INSULIN ANTIBODIES, BLOOD

## 2024-01-29 ENCOUNTER — Other Ambulatory Visit (HOSPITAL_COMMUNITY): Payer: Self-pay

## 2024-01-29 ENCOUNTER — Other Ambulatory Visit: Payer: Self-pay | Admitting: Physician Assistant

## 2024-01-29 ENCOUNTER — Ambulatory Visit: Payer: Self-pay | Admitting: Physician Assistant

## 2024-01-29 ENCOUNTER — Other Ambulatory Visit

## 2024-01-29 DIAGNOSIS — E0500 Thyrotoxicosis with diffuse goiter without thyrotoxic crisis or storm: Secondary | ICD-10-CM

## 2024-01-29 DIAGNOSIS — R7303 Prediabetes: Secondary | ICD-10-CM

## 2024-01-29 LAB — COMPREHENSIVE METABOLIC PANEL WITH GFR
ALT: 13 U/L (ref 0–35)
AST: 13 U/L (ref 0–37)
Albumin: 4.3 g/dL (ref 3.5–5.2)
Alkaline Phosphatase: 64 U/L (ref 39–117)
BUN: 21 mg/dL (ref 6–23)
CO2: 30 meq/L (ref 19–32)
Calcium: 9.4 mg/dL (ref 8.4–10.5)
Chloride: 106 meq/L (ref 96–112)
Creatinine, Ser: 0.69 mg/dL (ref 0.40–1.20)
GFR: 92.19 mL/min (ref >60.00)
Glucose, Bld: 119 mg/dL — ABNORMAL HIGH (ref 70–99)
Potassium: 4.5 meq/L (ref 3.5–5.1)
Sodium: 142 meq/L (ref 135–145)
Total Bilirubin: 0.3 mg/dL (ref 0.2–1.2)
Total Protein: 6.2 g/dL (ref 6.0–8.3)

## 2024-01-29 LAB — GENECONNECT MOLECULAR SCREEN: Genetic Analysis Overall Interpretation: NEGATIVE

## 2024-01-29 LAB — SURGICAL PATHOLOGY

## 2024-01-29 MED ORDER — METHIMAZOLE 10 MG PO TABS
10.0000 mg | ORAL_TABLET | Freq: Every day | ORAL | 0 refills | Status: DC
  Filled 2024-01-29: qty 30, 30d supply, fill #0

## 2024-01-29 NOTE — Telephone Encounter (Signed)
Please see patient's messages and advise

## 2024-01-30 ENCOUNTER — Other Ambulatory Visit (HOSPITAL_COMMUNITY): Payer: Self-pay

## 2024-01-30 ENCOUNTER — Other Ambulatory Visit: Payer: Self-pay

## 2024-01-30 ENCOUNTER — Encounter: Admitting: Physician Assistant

## 2024-01-30 DIAGNOSIS — R7989 Other specified abnormal findings of blood chemistry: Secondary | ICD-10-CM

## 2024-01-30 NOTE — Telephone Encounter (Signed)
 See results note.

## 2024-01-31 ENCOUNTER — Ambulatory Visit: Payer: Self-pay | Admitting: Gastroenterology

## 2024-02-01 ENCOUNTER — Ambulatory Visit: Admitting: Internal Medicine

## 2024-02-01 ENCOUNTER — Encounter: Payer: Self-pay | Admitting: Internal Medicine

## 2024-02-01 ENCOUNTER — Telehealth: Payer: Self-pay

## 2024-02-01 ENCOUNTER — Other Ambulatory Visit: Payer: Self-pay | Admitting: Physician Assistant

## 2024-02-01 ENCOUNTER — Other Ambulatory Visit (HOSPITAL_COMMUNITY): Payer: Self-pay

## 2024-02-01 VITALS — BP 134/70 | Ht 66.0 in | Wt 148.0 lb

## 2024-02-01 DIAGNOSIS — E0590 Thyrotoxicosis, unspecified without thyrotoxic crisis or storm: Secondary | ICD-10-CM | POA: Diagnosis not present

## 2024-02-01 DIAGNOSIS — R7303 Prediabetes: Secondary | ICD-10-CM

## 2024-02-01 DIAGNOSIS — E0500 Thyrotoxicosis with diffuse goiter without thyrotoxic crisis or storm: Secondary | ICD-10-CM | POA: Diagnosis not present

## 2024-02-01 MED ORDER — METHIMAZOLE 10 MG PO TABS
10.0000 mg | ORAL_TABLET | Freq: Every day | ORAL | 2 refills | Status: AC
  Filled 2024-02-01 – 2024-02-29 (×2): qty 30, 30d supply, fill #0
  Filled 2024-04-06: qty 30, 30d supply, fill #1
  Filled 2024-05-02: qty 30, 30d supply, fill #2

## 2024-02-01 NOTE — Telephone Encounter (Signed)
 Called pt and notified of Alyssa's suggestion and she understood also was questioning her blood work spoke with AA and she said she had everything she needed as of now [employer]

## 2024-02-01 NOTE — Progress Notes (Signed)
 Name: Sherry Decker  MRN/ DOB: [id number], [birth year]    Age/ Sex: 63 y.o., female    PCP: Allwardt, Mardy HERO, PA-C   Reason for Endocrinology Evaluation: Hyperthyroidism     Date of Initial Endocrinology Evaluation: 02/01/2024     HPI: Sherry Decker is a 63 y.o. female with a past medical history of OSA. The patient presented for initial endocrinology clinic visit on 02/01/2024 for consultative assistance with her Hyperthyroidism.    Dx with hyperthyroidism secondary to Graves' disease ~ 20 yrs ago The patient required methimazole for approximately 1 year, she did go into remission until this October,2025 when she has been noted with suppressed TSH,with elevated free T4 at 2.79 NG/DL, elevated free T3 at 7.6 PG/mL  Of note, the patient has elevated TRAb at 5.81 IU/ML   Patient has a pending referral to ophthalmology for Graves' disease   She is c/o heat intolerance for the past month Has noted palpitations  with minimal activity , despite training for long distance swim  She also noted facial flushing  Noted resolution of chronic constipation but now has noted urgency of defecation Has noted mild tremors especially in the morning  Has eye pain in the mornings , last eye exam 2 yrs  Patient does endorse right shin itching sensation  PREDIABETES: Patient also concerned about prediabetes status, she is very diligent with a low-carb diet and regular exercise by swimming.  Her sister has MODY, on an insulin pump, the son of the sister also has been diagnosed with DM She recently applied freestyle libre, and has tried a small amount of carbohydrates and her BG increased to 200 followed by a drop to 28 MGs/DL  Sister with DM with MODY , sister's son with DM   GAD-65 was undetectable  HISTORY:  Past Medical History:  Past Medical History:  Diagnosis Date   Anemia    Dry eyes    pt uses eye drops   DVT (deep venous thrombosis) (HCC) 2022   DVT had drains, was on blood  thinner hospitalized for 30 days, obtained a super bug that could noty be identified- was in [place].   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   Wheelchair dependent    Past Surgical History:  Past Surgical History:  Procedure Laterality Date   Abdominaloplasty     BREAST REDUCTION SURGERY     CHEILECTOMY Right 10/26/2022   Procedure: RIGHT GREAT TOE CHEILECTOMY;  Surgeon: Harden Jerona GAILS, MD;  Location: [facility] OR;  Service: Orthopedics;  Laterality: Right;   COLONOSCOPY  10/2019   pelvic colon blader, colon repair      reversal of tubal ligation  2004   TUBAL LIGATION  1999   VAGINAL HYSTERECTOMY  2023    Social History:  reports that she has never smoked. She has never used smokeless tobacco. She reports that she does  not currently use alcohol. She reports current drug use. Drug: Other-see comments. Family History: family history includes CAD in her father; Colon cancer in her maternal grandmother and paternal grandfather; Colon polyps in her father; Diabetes type II in her paternal grandmother and sister; Hyperthyroidism in her mother; Prostate cancer in her father; Skin cancer in her father; Testicular cancer in her brother; Valvular heart disease in her father.   HOME MEDICATIONS: Allergies as of 02/01/2024       Reactions   Codeine Nausea And Vomiting   Penicillins Rash        Medication List        Accurate as of February 01, 2024 11:49 AM. If you have any questions, ask your nurse or doctor.          STOP taking  these medications    acetaminophen  500 MG tablet Commonly known as: TYLENOL  Stopped by: Donell PARAS Astella Desir   celecoxib  200 MG capsule Commonly known as: CELEBREX  Stopped by: Donell PARAS Demontay Grantham   estradiol 0.1 MG/GM vaginal cream Commonly known as: ESTRACE Stopped by: Donell PARAS Jerney Baksh   OVER THE COUNTER MEDICATION Stopped by: Donell PARAS Alyssa Rotondo   Retin-A 0.05 % cream Generic drug: tretinoin Stopped by: Brooke Steinhilber J Tanganika Barradas   Ventolin  HFA 108 (90 Base) MCG/ACT inhaler Generic drug: albuterol  Stopped by: Donell PARAS Mechele Kittleson       TAKE these medications    methimazole 10 MG tablet Commonly known as: TAPAZOLE Take 1 tablet (10 mg total) by mouth daily.   metoprolol tartrate 25 MG tablet Commonly known as: LOPRESSOR Take 1 tablet (25 mg total) by mouth 2 (two) times daily.          REVIEW OF SYSTEMS: A comprehensive ROS was conducted with the patient and is negative except as per HPI     OBJECTIVE:  VS: BP 134/70   Ht 5' 6 (1.676 m)   Wt 148 lb (67.1 kg)   LMP  (LMP Unknown)   BMI 23.89 kg/m    Wt Readings from Last 3 Encounters:  02/01/24 148 lb (67.1 kg)  01/25/24 147 lb 3.2 oz (66.8 kg)  01/24/24 155 lb (70.3 kg)     EXAM: General: Pt appears well and is in NAD  Eyes: External eye exam normal without stare, lid lag or exophthalmos.  EOM intact.  PERRL.  Neck: General: Supple without adenopathy. Thyroid: Thyroid size normal.  No goiter or nodules appreciated. No thyroid bruit.  Lungs: Clear with good BS bilat   Heart: Auscultation: RRR.  Abdomen: Soft, nontender  Extremities:  BL LE: No pretibial edema   Mental Status: Judgment, insight: Intact Orientation: Oriented to time, place, and person Mood and affect: No depression, anxiety, or agitation     DATA REVIEWED:     Latest Reference Range & Units 01/29/24 14:48  Sodium 135 - 145 mEq/L 142  Potassium 3.5 - 5.1 mEq/L 4.5  Chloride 96 - 112 mEq/L 106  CO2 19 - 32 mEq/L 30   Glucose 70 - 99 mg/dL 880 (H)  BUN 6 - 23 mg/dL 21  Creatinine 9.59 - 8.79 mg/dL 9.30  Calcium 8.4 - 89.4 mg/dL 9.4  Alkaline Phosphatase 39 - 117 U/L 64  Albumin 3.5 - 5.2 g/dL 4.3  AST 0 - 37 U/L 13  ALT 0 - 35 U/L 13  Total Protein 6.0 - 8.3 g/dL 6.2  Total Bilirubin 0.2 - 1.2 mg/dL 0.3  GFR >39.99 mL/min 92.19    Latest Reference Range &  Units 01/25/24 14:10  TSH 0.35 - 5.50 uIU/mL <0.01 (L)  Triiodothyronine,Free,Serum 2.3 - 4.2 pg/mL 7.6 (H)  T4,Free(Direct) 0.60 - 1.60 ng/dL 7.20 (H)  (L): Data is abnormally low (H): Data is abnormally high    ASSESSMENT/PLAN/RECOMMENDATIONS:   Hyperthyroidism:  -Patient is symptomatic - She was prescribed methimazole for her PCP, she just picked up the prescription and took 1 tablet so far - She is also on atenolol twice daily, I did explain to the patient that she is intolerant to it I can switch to atenolol - I did advise the patient to reduce exercise until she is euthyroid - Patient will return in 4 weeks for repeat TFTs - I did explain to the patient the risk of cardiac arrhythmia with consecutive CHF with uncontrolled hyperthyroid and it is a priority to treat this condition despite her concerns with weight gain and hyperglycemia with decreased exercise   Medications :  Methimazole 10 mg daily Metoprolol 25 mg twice daily    2.  Prediabetes:  - GAD-65 negative, rest of autoantibodies pending - I did explain to the patient that freestyle herlene tends to skew numbers and I would use it figuratively rather than literally, especially in nondiabetic status - Encouraged patient to continue with low-carb diet and exercise  Follow-up in 3 months  Signed electronically by: Stefano Redgie Butts, MD  [facility] Endocrinology  [facility] [address] [place] [place], [place] [address] Phone: [phone] FAX: [phone]   CC: Allwardt, Mardy HERO, PA-C [address] [place] [address] Phone:  [phone] Fax: [phone]   Return to Endocrinology clinic as below: No future appointments.

## 2024-02-01 NOTE — Telephone Encounter (Signed)
 Please see pt msg and advise. It was my understanding repeat labs were not needed and all results had been received. FYI pt received call from lab tech to come back to repeat labs due to insufficient amount collected for tests ordered. No future labs were placed as clinical staff were never advised of repeats being needed.

## 2024-02-01 NOTE — Telephone Encounter (Signed)
 Copied from CRM #[id number]. Topic: Referral - Status >> Feb 01, 2024 10:41 AM Charolett L wrote: Reason for CRM: [facility] called in and stated that a referral was sent over and they don't deal with the thyroid  Please see msg regarding referral and advise where referral should be sent for patient

## 2024-02-05 ENCOUNTER — Encounter: Admitting: Physician Assistant

## 2024-02-05 LAB — IA-2 ANTIBODY: IA-2 Antibody: <5.4 U/mL (ref <5.4)

## 2024-02-05 LAB — GLUTAMIC ACID DECARBOXYLASE AUTO ABS: Glutamic Acid Decarb Ab: <5 [IU]/mL (ref <5)

## 2024-02-05 LAB — INSULIN ANTIBODIES, BLOOD: Insulin Antibodies, Human: <0.4 U/mL (ref <0.4)

## 2024-02-05 LAB — C-PEPTIDE: C-Peptide: 5.8 ng/mL — ABNORMAL HIGH (ref 0.80–3.85)

## 2024-02-08 ENCOUNTER — Other Ambulatory Visit: Payer: Self-pay

## 2024-02-09 ENCOUNTER — Other Ambulatory Visit (HOSPITAL_COMMUNITY): Payer: Self-pay

## 2024-02-09 ENCOUNTER — Other Ambulatory Visit: Payer: Self-pay | Admitting: Physician Assistant

## 2024-02-09 DIAGNOSIS — R7303 Prediabetes: Secondary | ICD-10-CM

## 2024-02-09 MED ORDER — FREESTYLE LIBRE 3 PLUS SENSOR MISC
5 refills | Status: AC
  Filled 2024-02-09: qty 1, 15d supply, fill #0

## 2024-02-15 ENCOUNTER — Encounter: Payer: Self-pay | Admitting: Internal Medicine

## 2024-02-16 ENCOUNTER — Other Ambulatory Visit (HOSPITAL_COMMUNITY): Payer: Self-pay

## 2024-02-16 MED ORDER — METOPROLOL SUCCINATE ER 50 MG PO TB24
50.0000 mg | ORAL_TABLET | Freq: Every day | ORAL | 3 refills | Status: AC
  Filled 2024-02-16: qty 90, 90d supply, fill #0
  Filled 2024-02-29 – 2024-05-02 (×2): qty 90, 90d supply, fill #1

## 2024-02-26 ENCOUNTER — Other Ambulatory Visit (HOSPITAL_COMMUNITY): Payer: Self-pay

## 2024-02-26 MED ORDER — MOUNJARO 2.5 MG/0.5ML ~~LOC~~ SOAJ
2.5000 mg | SUBCUTANEOUS | 0 refills | Status: AC
  Filled 2024-02-26 – 2024-02-29 (×2): qty 2, 28d supply, fill #0

## 2024-02-27 ENCOUNTER — Other Ambulatory Visit (HOSPITAL_COMMUNITY): Payer: Self-pay

## 2024-02-28 ENCOUNTER — Other Ambulatory Visit

## 2024-02-28 ENCOUNTER — Other Ambulatory Visit (HOSPITAL_COMMUNITY): Payer: Self-pay

## 2024-03-01 ENCOUNTER — Other Ambulatory Visit (HOSPITAL_COMMUNITY): Payer: Self-pay

## 2024-03-01 ENCOUNTER — Other Ambulatory Visit: Payer: Self-pay

## 2024-03-02 LAB — T3, FREE: T3, Free: 3.6 pg/mL (ref 2.3–4.2)

## 2024-03-02 LAB — T4, FREE: Free T4: 1.3 ng/dL (ref 0.8–1.8)

## 2024-03-02 LAB — TSH: TSH: 0.01 m[IU]/L — ABNORMAL LOW (ref 0.40–4.50)

## 2024-03-04 ENCOUNTER — Ambulatory Visit: Payer: Self-pay | Admitting: Internal Medicine

## 2024-03-25 ENCOUNTER — Other Ambulatory Visit: Payer: Self-pay

## 2024-03-25 DIAGNOSIS — R062 Wheezing: Secondary | ICD-10-CM

## 2024-03-26 ENCOUNTER — Ambulatory Visit (HOSPITAL_COMMUNITY)
Admission: RE | Admit: 2024-03-26 | Discharge: 2024-03-26 | Disposition: A | Discharge status: Home / Self Care | Source: Ambulatory Visit | Attending: Physician Assistant | Admitting: Physician Assistant

## 2024-03-26 ENCOUNTER — Ambulatory Visit: Payer: Self-pay | Admitting: Physician Assistant

## 2024-03-26 DIAGNOSIS — R062 Wheezing: Secondary | ICD-10-CM | POA: Diagnosis present

## 2024-03-26 LAB — PULMONARY FUNCTION TEST
DL/VA % pred: 79 %
DL/VA: 3.33 ml/min/mmHg/L
DLCO unc % pred: 82 %
DLCO unc: 16.77 ml/min/mmHg
FEF 25-75 Post: 3.67 L/s
FEF 25-75 Pre: 2.47 L/s
FEF2575-%Change-Post: 48 %
FEF2575-%Pred-Post: 163 %
FEF2575-%Pred-Pre: 109 %
FEV1-%Change-Post: 9 %
FEV1-%Pred-Post: 112 %
FEV1-%Pred-Pre: 102 %
FEV1-Post: 2.83 L
FEV1-Pre: 2.58 L
FEV1FVC-%Change-Post: 6 %
FEV1FVC-%Pred-Pre: 102 %
FEV6-%Change-Post: 3 %
FEV6-%Pred-Post: 106 %
FEV6-%Pred-Pre: 103 %
FEV6-Post: 3.38 L
FEV6-Pre: 3.27 L
FEV6FVC-%Pred-Post: 103 %
FEV6FVC-%Pred-Pre: 103 %
FVC-%Change-Post: 3 %
FVC-%Pred-Post: 102 %
FVC-%Pred-Pre: 99 %
FVC-Post: 3.38 L
FVC-Pre: 3.27 L
Post FEV1/FVC ratio: 84 %
Post FEV6/FVC ratio: 100 %
Pre FEV1/FVC ratio: 79 %
Pre FEV6/FVC Ratio: 100 %
RV % pred: 118 %
RV: 2.45 L
TLC % pred: 116 %
TLC: 6 L

## 2024-03-26 MED ORDER — ALBUTEROL SULFATE (2.5 MG/3ML) 0.083% IN NEBU
2.5000 mg | INHALATION_SOLUTION | Freq: Once | RESPIRATORY_TRACT | Status: AC
  Administered 2024-03-26: 2.5 mg via RESPIRATORY_TRACT

## 2024-03-26 NOTE — Progress Notes (Signed)
Spoke with patient and she expressed understanding of results.

## 2024-04-06 ENCOUNTER — Other Ambulatory Visit (HOSPITAL_COMMUNITY): Payer: Self-pay

## 2024-04-08 ENCOUNTER — Other Ambulatory Visit (HOSPITAL_COMMUNITY): Payer: Self-pay

## 2024-04-23 ENCOUNTER — Encounter: Payer: Self-pay | Admitting: Internal Medicine

## 2024-05-02 ENCOUNTER — Other Ambulatory Visit (HOSPITAL_COMMUNITY): Payer: Self-pay

## 2024-05-03 ENCOUNTER — Ambulatory Visit: Admitting: Internal Medicine

## 2024-07-19 ENCOUNTER — Ambulatory Visit: Admitting: Internal Medicine
